# Patient Record
Sex: Female | Born: 1980 | Race: White | Hispanic: Yes | Marital: Married | Smoking: Never smoker
Health system: Southern US, Community
[De-identification: ages and names within clinical notes are randomized; demographics above are authoritative.]

## PROBLEM LIST (undated history)

## (undated) ENCOUNTER — Inpatient Hospital Stay (HOSPITAL_COMMUNITY): Payer: Self-pay

## (undated) DIAGNOSIS — Z789 Other specified health status: Secondary | ICD-10-CM

## (undated) DIAGNOSIS — IMO0001 Reserved for inherently not codable concepts without codable children: Secondary | ICD-10-CM

## (undated) HISTORY — DX: Reserved for inherently not codable concepts without codable children: IMO0001

## (undated) HISTORY — PX: NO PAST SURGERIES: SHX2092

## (undated) HISTORY — PX: OTHER SURGICAL HISTORY: SHX169

---

## 2012-08-19 ENCOUNTER — Encounter (HOSPITAL_COMMUNITY): Payer: Self-pay | Admitting: Emergency Medicine

## 2012-08-19 ENCOUNTER — Emergency Department (HOSPITAL_COMMUNITY)
Admission: EM | Admit: 2012-08-19 | Discharge: 2012-08-19 | Disposition: A | Discharge status: Home / Self Care | Payer: Self-pay | Attending: Emergency Medicine | Admitting: Emergency Medicine

## 2012-08-19 DIAGNOSIS — S61219A Laceration without foreign body of unspecified finger without damage to nail, initial encounter: Secondary | ICD-10-CM

## 2012-08-19 DIAGNOSIS — S61209A Unspecified open wound of unspecified finger without damage to nail, initial encounter: Secondary | ICD-10-CM | POA: Insufficient documentation

## 2012-08-19 DIAGNOSIS — Z79899 Other long term (current) drug therapy: Secondary | ICD-10-CM | POA: Insufficient documentation

## 2012-08-19 DIAGNOSIS — W260XXA Contact with knife, initial encounter: Secondary | ICD-10-CM | POA: Insufficient documentation

## 2012-08-19 DIAGNOSIS — Y9289 Other specified places as the place of occurrence of the external cause: Secondary | ICD-10-CM | POA: Insufficient documentation

## 2012-08-19 DIAGNOSIS — Y9389 Activity, other specified: Secondary | ICD-10-CM | POA: Insufficient documentation

## 2012-08-19 MED ORDER — HYDROCODONE-ACETAMINOPHEN 5-325 MG PO TABS: 1.0000 | ORAL_TABLET | ORAL | Status: DC | PRN

## 2012-08-19 NOTE — ED Provider Notes (Signed)
History    This chart was scribed for non-physician practitioner working with Selena Sprout, MD by Toya Smothers, ED Scribe. This patient was seen in room TR05C/TR05C and the patient's care was started at 9:45 PM.   CSN: [id number]  Arrival date & time 08/19/12  1912   First MD Initiated Contact with Patient 08/19/12 2133      Chief Complaint  Patient presents with  . Laceration   Patient is a 32 y.o. female presenting with skin laceration. The history is provided by the patient.  Laceration Location:  Finger Finger laceration location:  L index finger Quality: avulsion   Bleeding: controlled with pressure   Time since incident:  2 hours Laceration mechanism:  Knife Pain details:    Severity:  Mild Foreign body present:  No foreign bodies Relieved by:  Nothing Worsened by:  Nothing tried Ineffective treatments:  None tried Tetanus status:  Unknown   Selena Villanueva is a 32 y.o. female who presents to the ED c/o a flap laceration to the left index finger. Pain is mild. Pt reports that she cut her finger with a knife while opening a bag of flour. Blood loss was minimal, and bleeding is controlled with pressure. No fever, chills, cough, congestion, rhinorrhea, chest pain, SOB, or n/v/d. No suspected foreign body. Pt denies use of tobacco, alcohol, and illicit drug use. Pt is right handed. Tetanus status is unknown.   History reviewed. No pertinent past medical history.  History reviewed. No pertinent past surgical history.  No family history on file.  History  Substance Use Topics  . Smoking status: Never Smoker   . Smokeless tobacco: Not on file  . Alcohol Use: No    Review of Systems  Skin: Positive for wound.  All other systems reviewed and are negative.    Allergies  Review of patient's allergies indicates no known allergies.  Home Medications   Current Outpatient Rx  Name  Route  Sig  Dispense  Refill  . BIOTIN PO   Oral   Take 1 tablet by mouth 2 (two)  times daily.         . Multiple Vitamin (MULTIVITAMIN WITH MINERALS) TABS   Oral   Take 1 tablet by mouth daily.         . norethindrone (MICRONOR,CAMILA,ERRIN) 0.35 MG tablet   Oral   Take 1 tablet by mouth daily.         . Prenatal Vit-Fe Fumarate-FA (PRENATAL PO)   Oral   Take 1 tablet by mouth daily.           BP 117/63  Pulse 77  Temp(Src) 98 F (36.7 C) (Oral)  Resp 16  SpO2 97%  LMP 08/05/2012  Physical Exam  Nursing note and vitals reviewed. Constitutional: She is oriented to person, place, and time. She appears well-developed and well-nourished. No distress.  HENT:  Head: Normocephalic and atraumatic.  Eyes: EOM are normal.  Neck: Neck supple. No tracheal deviation present.  Cardiovascular: Normal rate.   Pulmonary/Chest: Effort normal. No respiratory distress.  Musculoskeletal: Normal range of motion.  Good flexion and extension.  Neurological: She is alert and oriented to person, place, and time.  Sensation is intact.  Skin: Skin is warm and dry.  Flat laceration to the palmer surface. Left second finger.  Psychiatric: She has a normal mood and affect. Her behavior is normal.    ED Course  Procedures DIAGNOSTIC STUDIES: Oxygen Saturation is 97% on room air, normal by my interpretation.  COORDINATION OF CARE: 21:49- Evaluated Pt. Pt is awake, alert, and without distress. 21:52- Patient understands and agrees with initial ED impression and plan with expectations set for ED visit.  LACERATION REPAIR Performed by: Kathie Dike, P Consent: Verbal consent obtained. Risks and benefits: risks, benefits and alternatives were discussed Patient identity confirmed: provided demographic data Time out performed prior to procedure Prepped and Draped in normal sterile fashion Wound explored Laceration Location: Palmer surface of left second finger. Laceration Length: 2.6 cm No Foreign Bodies seen or palpated Anesthesia: local infiltration Local  anesthetic: lidocaine1*% Anesthetic total:2 ml Irrigation method: syringe Amount of cleaning: standard Skin closure: suture Number of sutures 5 Technique:interupted Patient tolerance: Patient tolerated the procedure well with no immediate complications.     Labs Reviewed - No data to display No results found.   No diagnosis found.    MDM  I have reviewed nursing notes, vital signs, and all appropriate lab and imaging results for this patient.  Pt sustained a laceration of the left index finger. Laceration repaired with 5 sutures of 4-0 nylon. Pt to have sutures removed in 7 days. She will return sooner if any changes or problem.    *I personally performed the services described in this documentation, which was scribed in my presence. The recorded information has been reviewed and is accurate.Kathie Dike, PA-C 08/19/12 2226

## 2012-08-19 NOTE — ED Notes (Signed)
Pt st's she cut her left index finger with a knife while trying to open flour. Pt has flap lac to tip of finger.

## 2012-08-20 NOTE — ED Provider Notes (Signed)
Medical screening examination/treatment/procedure(s) were performed by non-physician practitioner and as supervising physician I was immediately available for consultation/collaboration.   Gwyneth Sprout, MD 08/20/12 [phone]

## 2014-02-08 ENCOUNTER — Other Ambulatory Visit: Payer: Self-pay | Admitting: Otolaryngology

## 2014-02-08 DIAGNOSIS — R42 Dizziness and giddiness: Secondary | ICD-10-CM

## 2014-02-09 ENCOUNTER — Ambulatory Visit
Admission: RE | Admit: 2014-02-09 | Discharge: 2014-02-09 | Disposition: A | Discharge status: Home / Self Care | Payer: BC Managed Care – PPO | Source: Ambulatory Visit | Attending: Otolaryngology | Admitting: Otolaryngology

## 2014-02-09 DIAGNOSIS — R42 Dizziness and giddiness: Secondary | ICD-10-CM

## 2014-02-09 MED ORDER — GADOBENATE DIMEGLUMINE 529 MG/ML IV SOLN
12.0000 mL | Freq: Once | INTRAVENOUS | Status: AC | PRN
  Administered 2014-02-09: 12 mL via INTRAVENOUS

## 2014-05-28 ENCOUNTER — Emergency Department (HOSPITAL_COMMUNITY)
Admission: EM | Admit: 2014-05-28 | Discharge: 2014-05-28 | Disposition: A | Discharge status: Home / Self Care | Payer: BLUE CROSS/BLUE SHIELD | Attending: Emergency Medicine | Admitting: Emergency Medicine

## 2014-05-28 ENCOUNTER — Encounter (HOSPITAL_COMMUNITY): Payer: Self-pay | Admitting: Emergency Medicine

## 2014-05-28 ENCOUNTER — Emergency Department (HOSPITAL_COMMUNITY): Payer: BLUE CROSS/BLUE SHIELD

## 2014-05-28 DIAGNOSIS — Z79899 Other long term (current) drug therapy: Secondary | ICD-10-CM | POA: Insufficient documentation

## 2014-05-28 DIAGNOSIS — R202 Paresthesia of skin: Secondary | ICD-10-CM

## 2014-05-28 DIAGNOSIS — R0981 Nasal congestion: Secondary | ICD-10-CM | POA: Diagnosis not present

## 2014-05-28 DIAGNOSIS — Z880 Allergy status to penicillin: Secondary | ICD-10-CM | POA: Diagnosis not present

## 2014-05-28 DIAGNOSIS — R0789 Other chest pain: Secondary | ICD-10-CM | POA: Insufficient documentation

## 2014-05-28 DIAGNOSIS — Z3202 Encounter for pregnancy test, result negative: Secondary | ICD-10-CM | POA: Diagnosis not present

## 2014-05-28 DIAGNOSIS — R42 Dizziness and giddiness: Secondary | ICD-10-CM | POA: Diagnosis present

## 2014-05-28 LAB — CBC WITH DIFFERENTIAL/PLATELET
BASOS PCT: 0 % (ref 0–1)
Basophils Absolute: 0 10*3/uL (ref 0.0–0.1)
Eosinophils Absolute: 0 10*3/uL (ref 0.0–0.7)
Eosinophils Relative: 0 % (ref 0–5)
HCT: 37 % (ref 36.0–46.0)
HEMOGLOBIN: 12.1 g/dL (ref 12.0–15.0)
LYMPHS ABS: 2.2 10*3/uL (ref 0.7–4.0)
Lymphocytes Relative: 29 % (ref 12–46)
MCH: 29 pg (ref 26.0–34.0)
MCHC: 32.7 g/dL (ref 30.0–36.0)
MCV: 88.7 fL (ref 78.0–100.0)
MONO ABS: 0.5 10*3/uL (ref 0.1–1.0)
Monocytes Relative: 7 % (ref 3–12)
NEUTROS ABS: 4.6 10*3/uL (ref 1.7–7.7)
NEUTROS PCT: 64 % (ref 43–77)
Platelets: 267 10*3/uL (ref 150–400)
RBC: 4.17 MIL/uL (ref 3.87–5.11)
RDW: 12.5 % (ref 11.5–15.5)
WBC: 7.4 10*3/uL (ref 4.0–10.5)

## 2014-05-28 LAB — BASIC METABOLIC PANEL
Anion gap: 7 (ref 5–15)
BUN: 13 mg/dL (ref 6–23)
CALCIUM: 9.4 mg/dL (ref 8.4–10.5)
CO2: 26 mmol/L (ref 19–32)
Chloride: 106 mmol/L (ref 96–112)
Creatinine, Ser: 0.63 mg/dL (ref 0.50–1.10)
GLUCOSE: 89 mg/dL (ref 70–99)
POTASSIUM: 4 mmol/L (ref 3.5–5.1)
SODIUM: 139 mmol/L (ref 135–145)

## 2014-05-28 LAB — I-STAT TROPONIN, ED: Troponin i, poc: 0 ng/mL (ref 0.00–0.08)

## 2014-05-28 LAB — POC URINE PREG, ED: PREG TEST UR: NEGATIVE

## 2014-05-28 NOTE — ED Provider Notes (Signed)
CSN: [id number]     Arrival date & time 05/28/14  1343 History   First MD Initiated Contact with Patient 05/28/14 1507     Chief Complaint  Patient presents with  . Dizziness  . arm numbness      (Consider location/radiation/quality/duration/timing/severity/associated sxs/prior Treatment) HPI Comments: Selena Villanueva is a 34 y.o. female with a PMHx of lightheadedness, vitamin D deficiency, and intermittent paresthesias, who presents to the ED with complaints of ongoing intermittent lightheadedness and left arm paresthesia which initially began last August in after being seen by an ear nose and throat specialist and having an MRI of her brain done, no etiology was found. Her symptoms resolved by December, but approximately 2 weeks ago they began again. Her symptoms are associated with a sensation of nasal congestion, and intermittent chest pressure anteriorly without pain. She states that she took meclizine earlier which has helped with her symptoms, and at this moment they are partially resolved. Her lightheadedness occasionally is elicited with head movement, but not always. She cannot pinpoint a specific aggravating factor. She denies any fevers, chills, chest pain, shortness of breath, cough, hemoptysis, PND, lower extremity swelling, orthopnea, recent travel, surgeries, immobilization, estrogen use, IV drug use, rhinorrhea, tinnitus, hearing loss, URI symptoms, abdominal pain, nausea, vomiting, diarrhea, constipation, dysuria, hematuria, melena, hematochezia, vaginal bleeding or discharge, vision changes, headache, vertigo, syncope, or recent change in medications. She has no family or personal history of DVT/PE/heart disease. Last menstrual period was January 14 through the 18th.  Patient is a 34 y.o. female presenting with dizziness. The history is provided by the patient. No language interpreter was used.  Dizziness Quality:  Lightheadedness Severity:  Moderate Onset quality:   Gradual Duration:  2 weeks Timing:  Intermittent Progression:  Partially resolved Chronicity:  Recurrent Context: head movement   Relieved by:  Medication (meclizine) Worsened by:  Turning head Ineffective treatments:  None tried Associated symptoms: no blood in stool, no chest pain, no diarrhea, no headaches, no hearing loss, no nausea, no palpitations, no shortness of breath, no syncope, no tinnitus, no vision changes, no vomiting and no weakness   Risk factors: no new medications     History reviewed. No pertinent past medical history. History reviewed. No pertinent past surgical history. No family history on file. History  Substance Use Topics  . Smoking status: Never Smoker   . Smokeless tobacco: Not on file  . Alcohol Use: No   OB History    No data available     Review of Systems  Constitutional: Negative for fever, chills and diaphoresis.  HENT: Positive for congestion (nasal). Negative for ear discharge, ear pain, hearing loss, rhinorrhea, sinus pressure, sore throat, tinnitus and trouble swallowing.   Eyes: Negative for visual disturbance.  Respiratory: Positive for chest tightness ("pressure"). Negative for cough, shortness of breath and wheezing.   Cardiovascular: Negative for chest pain, palpitations, leg swelling and syncope.  Gastrointestinal: Negative for nausea, vomiting, abdominal pain, diarrhea, constipation and blood in stool.  Genitourinary: Negative for dysuria, hematuria, flank pain, vaginal bleeding, vaginal discharge and menstrual problem.  Musculoskeletal: Negative for myalgias and arthralgias.  Skin: Negative for color change and rash.  Allergic/Immunologic: Negative for immunocompromised state.  Neurological: Positive for light-headedness. Negative for dizziness, syncope, weakness, numbness and headaches.       +L arm paresthesias  Psychiatric/Behavioral: Negative for confusion.   10 Systems reviewed and are negative for acute change except as  noted in the HPI.    Allergies  Amoxicillin  Home Medications   Prior to Admission medications   Medication Sig Start Date End Date Taking? Authorizing Provider  BIOTIN PO Take 1 tablet by mouth 2 (two) times daily.    Historical Provider, MD  HYDROcodone-acetaminophen (NORCO/VICODIN) 5-325 MG per tablet Take 1 tablet by mouth every 4 (four) hours as needed for pain. 08/19/12   Kathie Dike, PA-C  Multiple Vitamin (MULTIVITAMIN WITH MINERALS) TABS Take 1 tablet by mouth daily.    Historical Provider, MD  norethindrone (MICRONOR,CAMILA,ERRIN) 0.35 MG tablet Take 1 tablet by mouth daily.    Historical Provider, MD  Prenatal Vit-Fe Fumarate-FA (PRENATAL PO) Take 1 tablet by mouth daily.    Historical Provider, MD   BP 97/61 mmHg  Pulse 79  Temp(Src) 98.2 F (36.8 C) (Oral)  Resp 16  SpO2 99%  LMP 05/17/2014 Physical Exam  Constitutional: She is oriented to person, place, and time. She appears well-developed and well-nourished.  Non-toxic appearance. No distress.  Afebrile, nontoxic, NAD, VSS aside from mildly low BP at 91/61  HENT:  Head: Normocephalic and atraumatic.  Right Ear: Hearing, tympanic membrane, external ear and ear canal normal.  Left Ear: Hearing, tympanic membrane, external ear and ear canal normal.  Nose: Nose normal. Right sinus exhibits no maxillary sinus tenderness and no frontal sinus tenderness. Left sinus exhibits no maxillary sinus tenderness and no frontal sinus tenderness.  Mouth/Throat: Uvula is midline, oropharynx is clear and moist and mucous membranes are normal. No trismus in the jaw. No uvula swelling.  Ears clear bilaterally Nose clear without edema or rhinorrhea No sinus TTP Oropharynx clear without tonsillar swelling/exudates, uvula midline with symmetric tongue protrusion  Eyes: Conjunctivae and EOM are normal. Pupils are equal, round, and reactive to light. Right eye exhibits no discharge. Left eye exhibits no discharge.  PERRL, EOMI without  nystagmus  Neck: Normal range of motion. Neck supple.  No meningismus  Cardiovascular: Normal rate, regular rhythm, normal heart sounds and intact distal pulses.  Exam reveals no gallop and no friction rub.   No murmur heard. RRR, nl s1/s2, no m/r/g, distal pulses intact, no pedal edema   Pulmonary/Chest: Effort normal and breath sounds normal. No respiratory distress. She has no decreased breath sounds. She has no wheezes. She has no rhonchi. She has no rales.  Abdominal: Soft. Normal appearance and bowel sounds are normal. She exhibits no distension. There is no tenderness. There is no rigidity, no rebound and no guarding.  Musculoskeletal: Normal range of motion.  MAE x4 Strength 5/5 in all extremities Sensation grossly intact in all extremities No pedal edema Distal pulses intact Gait steady  Neurological: She is alert and oriented to person, place, and time. She has normal strength. No cranial nerve deficit or sensory deficit. She displays a negative Romberg sign. Coordination and gait normal. GCS eye subscore is 4. GCS verbal subscore is 5. GCS motor subscore is 6.  CN 2-12 grossly intact A&O x4 GCS 15 Sensation and strength intact Gait nonataxic including with tandem walking Coordination with finger-to-nose WNL Neg romberg, neg pronator drift   Skin: Skin is warm, dry and intact. No rash noted.  Psychiatric: She has a normal mood and affect.  Nursing note and vitals reviewed.   ED Course  Procedures (including critical care time) 16:04 Orthostatic Vital Signs JP  Orthostatic Lying  - BP- Lying: 112/60 mmHg ; Pulse- Lying: 60  Orthostatic Sitting - BP- Sitting: 108/59 mmHg ; Pulse- Sitting: 68  Orthostatic Standing at 0 minutes - BP- Standing  at 0 minutes: 106/65 mmHg ; Pulse- Standing at 0 minutes: 79    Labs Review Labs Reviewed  CBC WITH DIFFERENTIAL/PLATELET  BASIC METABOLIC PANEL  I-STAT TROPOININ, ED  POC URINE PREG, ED    Imaging Review Dg Chest 2  View  05/28/2014   CLINICAL DATA:  Chest pressure radiating into the back with dizziness beginning today.  EXAM: CHEST  2 VIEW  COMPARISON:  PA and lateral chest 04/09/2014.  FINDINGS: Heart size and mediastinal contours are within normal limits. Both lungs are clear. Visualized skeletal structures are unremarkable.  IMPRESSION: Negative exam.   Electronically Signed   By: Drusilla Kannerhomas  Dalessio M.D.   On: 05/28/2014 16:31   MRI 02/09/14: Negative   EKG Interpretation   Date/Time:  Monday May 28 2014 15:49:55 EST Ventricular Rate:  71 PR Interval:  135 QRS Duration: 88 QT Interval:  394 QTC Calculation: 428 R Axis:   75 Text Interpretation:  Sinus rhythm Confirmed by Lincoln Brighamees, Liz [phone]([id number]) on  05/28/2014 4:31:06 PM      MDM   Final diagnoses:  Chest pressure  Episodic lightheadedness  Paresthesias    34 y.o. female with multiple complaints which are chronic, including intermittent lightheadedness and L arm paresthesia currently resolved. Also sinus congestion. Now also having some chest pressure without pain. PERC neg, doubt DVT/PE. Has had neg MRI and work up in the past, nonfocal neuro exam, doubt need for emergent imaging of brain. Doubt meningitis, CVA/TIA, temporal arteritis, SAH, or other intracranial pathology. Will obtain EKG, CXR, basic labs, and orthostatics. If no etiology seen, will have her f/up with PCP for ongoing evaluation. Possibly could be vitamin deficiency. BP slightly low here which could be normal variant, will get orthostatics then give IVFs if positive. Will reassess shortly.   4:44 PM Orthostatics neg, and BP has improved to 112/60 lying and 106/65 standing. All labs WNL, trop neg, CXR WNL, EKG NSR. Doubt ACS at this time. No emergent etiology for her symptoms are found at this time. Discussed f/up with her PCP for ongoing evaluation and management of her chronic symptoms. Will instruct pt to continue using meclizine for symptoms. I explained the diagnosis and have  given explicit precautions to return to the ER including for any other new or worsening symptoms. The patient understands and accepts the medical plan as it's been dictated and I have answered their questions. Discharge instructions concerning home care and prescriptions have been given. The patient is STABLE and is discharged to home in good condition.   BP 106/65 mmHg  Pulse 79  Temp(Src) 98.2 F (36.8 C) (Oral)  Resp 16  SpO2 99%  LMP 05/17/2014     Donnita FallsMercedes Strupp [place] Point[place], PA-C 05/28/14 1653  Tilden FossaElizabeth Rees, MD 05/28/14 1946

## 2014-05-28 NOTE — ED Notes (Signed)
Pt states that she has been having dizziness, trouble with her breathing since mid August 2015.  Pt states that pver the past week the symptoms have returned then today had near syncopal episode, facial and left arm numbness.  Pt states that she took Naso cort this am.

## 2014-05-28 NOTE — Discharge Instructions (Signed)
Continue to use your home meclizine for your symptoms. Stay well hydrated and continue your usual home medications. See your regular doctor for ongoing management of your symptoms. Return to the ER for changes or worsening symptoms.   Benign Positional Vertigo Vertigo means you feel like you or your surroundings are moving when they are not. Benign positional vertigo is the most common form of vertigo. Benign means that the cause of your condition is not serious. Benign positional vertigo is more common in older adults. CAUSES  Benign positional vertigo is the result of an upset in the labyrinth system. This is an area in the middle ear that helps control your balance. This may be caused by a viral infection, head injury, or repetitive motion. However, often no specific cause is found. SYMPTOMS  Symptoms of benign positional vertigo occur when you move your head or eyes in different directions. Some of the symptoms may include:  Loss of balance and falls.  Vomiting.  Blurred vision.  Dizziness.  Nausea.  Involuntary eye movements (nystagmus). DIAGNOSIS  Benign positional vertigo is usually diagnosed by physical exam. If the specific cause of your benign positional vertigo is unknown, your caregiver may perform imaging tests, such as magnetic resonance imaging (MRI) or computed tomography (CT). TREATMENT  Your caregiver may recommend movements or procedures to correct the benign positional vertigo. Medicines such as meclizine, benzodiazepines, and medicines for nausea may be used to treat your symptoms. In rare cases, if your symptoms are caused by certain conditions that affect the inner ear, you may need surgery. HOME CARE INSTRUCTIONS   Follow your caregiver's instructions.  Move slowly. Do not make sudden body or head movements.  Avoid driving.  Avoid operating heavy machinery.  Avoid performing any tasks that would be dangerous to you or others during a vertigo  episode.  Drink enough fluids to keep your urine clear or pale yellow. SEEK IMMEDIATE MEDICAL CARE IF:   You develop problems with walking, weakness, numbness, or using your arms, hands, or legs.  You have difficulty speaking.  You develop severe headaches.  Your nausea or vomiting continues or gets worse.  You develop visual changes.  Your family or friends notice any behavioral changes.  Your condition gets worse.  You have a fever.  You develop a stiff neck or sensitivity to light. MAKE SURE YOU:   Understand these instructions.  Will watch your condition.  Will get help right away if you are not doing well or get worse. Document Released: 01/26/2006 Document Revised: 07/13/2011 Document Reviewed: 01/08/2011 [facility] Patient Information 2015 [place], [place]. This information is not intended to replace advice given to you by your health care provider. Make sure you discuss any questions you have with your health care provider.  Paresthesia Paresthesia is a burning or prickling feeling. This feeling can happen in any part of the body. It often happens in the hands, arms, legs, or feet. HOME CARE  Avoid drinking alcohol.  Try massage or needle therapy (acupuncture) to help with your problems.  Keep all doctor visits as told. GET HELP RIGHT AWAY IF:   You feel weak.  You have trouble walking or moving.  You have problems speaking or seeing.  You feel confused.  You cannot control when you poop (bowel movement) or pee (urinate).  You lose feeling (numbness) after an injury.  You pass out (faint).  Your burning or prickling feeling gets worse when you walk.  You have pain, cramps, or feel dizzy.  You  have a rash. MAKE SURE YOU:   Understand these instructions.  Will watch your condition.  Will get help right away if you are not doing well or get worse. Document Released: 04/02/2008 Document Revised: 07/13/2011 Document Reviewed: 01/09/2011 [facility] [employer]  Patient Information 2015 [place]. This information is not intended to replace advice given to you by your health care provider. Make sure you discuss any questions you have with your health care provider.

## 2014-06-21 ENCOUNTER — Encounter: Payer: Self-pay | Admitting: Neurology

## 2014-06-21 ENCOUNTER — Ambulatory Visit (INDEPENDENT_AMBULATORY_CARE_PROVIDER_SITE_OTHER): Payer: BLUE CROSS/BLUE SHIELD | Admitting: Neurology

## 2014-06-21 VITALS — BP 109/63 | HR 68 | Ht 62.0 in | Wt 147.0 lb

## 2014-06-21 DIAGNOSIS — R42 Dizziness and giddiness: Secondary | ICD-10-CM

## 2014-06-21 NOTE — Progress Notes (Signed)
PATIENT: Selena Villanueva DOB: 09/01/1980  HISTORICAL  Selena Villanueva 34 yo RH female, referred by her primary care physician, PA Selena Villanueva for evaluation of dizziness,  Since August 2015, she began to note disc intermittent lightheadedness, it can happen in sitting down, standing up position, worsening by sudden body movement, but she denies vertigo, no visual change, no gait difficulty, intermittent initially, then become constant, she was evaluated by ENT, reported normal hearing, vestibular testing, MRI of the brain with and without contrast October 2015 was essentially normal,  Her symptoms has improved in December, but returned in January 2016, she also noticed associated chest pressure, heart palpitation, anxious feelings,  REVIEW OF SYSTEMS: Full 14 system review of systems performed and notable only for as above ALLERGIES: Allergies  Allergen Reactions  . Amoxicillin Diarrhea    HOME MEDICATIONS: Current Outpatient Prescriptions  Medication Sig Dispense Refill  . BIOTIN PO Take 1 tablet by mouth daily.     . cetirizine (ZYRTEC) 10 MG tablet Take 10 mg by mouth as needed for allergies.    . Cholecalciferol (VITAMIN D) 2000 UNITS CAPS Take 1 capsule by mouth daily.    Marland Kitchen. dimenhyDRINATE (DRAMAMINE) 50 MG tablet Take 50 mg by mouth every 8 (eight) hours as needed.    . Prenatal Vit-Fe Fumarate-FA (MULTIVITAMIN-PRENATAL) 27-0.8 MG TABS tablet Take 1 tablet by mouth daily at 12 noon.    . triamcinolone (NASACORT) 55 MCG/ACT AERO nasal inhaler Place 1 spray into the nose daily as needed (for allergies).    . vitamin B-12 (CYANOCOBALAMIN) 500 MCG tablet Take 500 mcg by mouth daily.     No current facility-administered medications for this visit.    PAST MEDICAL HISTORY: Past Medical History  Diagnosis Date  . Healthy adult     PAST SURGICAL HISTORY: Past Surgical History  Procedure Laterality Date  . No past surgery      FAMILY HISTORY: Family History  Problem  Relation Age of Onset  . Healthy Father   . Healthy Mother     SOCIAL HISTORY:  History   Social History  . Marital Status: Married    Spouse Name: Married  . Number of Children: 0  . Years of Education: 14   Occupational History  . Librarian, academicLegal Assistant    Social History Main Topics  . Smoking status: Never Smoker   . Smokeless tobacco: Not on file  . Alcohol Use: 0.0 oz/week    0 Standard drinks or equivalent per week     Comment: Socially  . Drug Use: No  . Sexual Activity: Not on file   Other Topics Concern  . Not on file   Social History Narrative   Right-handed.   Lives at home with her husband.   2-3 cups caffeine/day.    PHYSICAL EXAM   Filed Vitals:   06/21/14 0816  BP: 109/63  Pulse: 68  Height: 5\' 2"  (1.575 m)  Weight: 147 lb (66.679 kg)    Not recorded      Body mass index is 26.88 kg/(m^2).   Generalized: In no acute distress  Neck: Supple, no carotid bruits   Cardiac: Regular rate rhythm  Pulmonary: Clear to auscultation bilaterally  Musculoskeletal: No deformity  Neurological examination  Mentation: Alert oriented to time, place, history taking, and causual conversation  Cranial nerve II-XII: Pupils were equal round reactive to light. Extraocular movements were full.  Visual field were full on confrontational test. Bilateral fundi were sharp.  Facial sensation and strength  were normal. Hearing was intact to finger rubbing bilaterally. Uvula tongue midline.  Head turning and shoulder shrug and were normal and symmetric.Tongue protrusion into cheek strength was normal.  Motor: Normal tone, bulk and strength.  Sensory: Intact to fine touch, pinprick, preserved vibratory sensation, and proprioception at toes.  Coordination: Normal finger to nose, heel-to-shin bilaterally there was no truncal ataxia  Gait: Rising up from seated position without assistance, normal stance, without trunk ataxia, moderate stride, good arm swing, smooth  turning, able to perform tiptoe, and heel walking without difficulty.   Romberg signs: Negative  Deep tendon reflexes: Brachioradialis 2/2, biceps 2/2, triceps 2/2, patellar 2/2, Achilles 2/2, plantar responses were flexor bilaterally.   DIAGNOSTIC DATA (LABS, IMAGING, TESTING) - I reviewed patient records, labs, notes, testing and imaging myself where available.  Lab Results  Component Value Date   WBC 7.4 05/28/2014   HGB 12.1 05/28/2014   HCT 37.0 05/28/2014   MCV 88.7 05/28/2014   PLT 267 05/28/2014      Component Value Date/Time   NA 139 05/28/2014 1555   K 4.0 05/28/2014 1555   CL 106 05/28/2014 1555   CO2 26 05/28/2014 1555   GLUCOSE 89 05/28/2014 1555   BUN 13 05/28/2014 1555   CREATININE 0.63 05/28/2014 1555   CALCIUM 9.4 05/28/2014 1555   GFRNONAA >90 05/28/2014 1555   GFRAA >90 05/28/2014 1555   No results found for: CHOL, HDL, LDLCALC, LDLDIRECT, TRIG, CHOLHDL No results found for: ZOXW9U No results found for: VITAMINB12 No results found for: TSH    ASSESSMENT AND PLAN  Selena Villanueva is a 34 y.o. female complains of lightheadedness, essentially normal neurological examination, normal MRI of the brain, negative internal acoustic canal,  Nonspecific complaints, no neurological etiology found,  Continue follow-up with her primary care physician,    No orders of the defined types were placed in this encounter.    New Prescriptions   No medications on file    Medications Discontinued During This Encounter  Medication Reason  . Acetaminophen-Caffeine 500-65 MG TABS Therapy completed  . HYDROcodone-acetaminophen (NORCO/VICODIN) 5-325 MG per tablet Therapy completed  . Magnesium 200 MG TABS Patient Preference  . Meclizine HCl 25 MG CHEW Therapy completed  . Omega-3 Fatty Acids (OMEGA 3 PO) Patient Preference    No Follow-up on file. Selena Villanueva, M.D. Ph.D.  [facility] [address], [address] [place], [place] [address] Ph: [phone] Fax: [phone]

## 2014-06-22 ENCOUNTER — Telehealth: Payer: Self-pay | Admitting: Neurology

## 2014-06-22 ENCOUNTER — Encounter: Payer: Self-pay | Admitting: Neurology

## 2014-06-27 NOTE — Telephone Encounter (Signed)
error 

## 2015-03-04 LAB — OB RESULTS CONSOLE ABO/RH: RH TYPE: POSITIVE

## 2015-03-04 LAB — OB RESULTS CONSOLE GC/CHLAMYDIA
Chlamydia: NEGATIVE
GC PROBE AMP, GENITAL: NEGATIVE

## 2015-03-04 LAB — OB RESULTS CONSOLE RPR: RPR: NONREACTIVE

## 2015-03-04 LAB — OB RESULTS CONSOLE RUBELLA ANTIBODY, IGM: RUBELLA: IMMUNE

## 2015-03-04 LAB — OB RESULTS CONSOLE HIV ANTIBODY (ROUTINE TESTING): HIV: NONREACTIVE

## 2015-03-04 LAB — OB RESULTS CONSOLE ANTIBODY SCREEN: ANTIBODY SCREEN: NEGATIVE

## 2015-03-04 LAB — OB RESULTS CONSOLE HEPATITIS B SURFACE ANTIGEN: HEP B S AG: NEGATIVE

## 2015-03-16 ENCOUNTER — Encounter (HOSPITAL_COMMUNITY): Payer: Self-pay

## 2015-03-16 ENCOUNTER — Inpatient Hospital Stay (HOSPITAL_COMMUNITY): Payer: BLUE CROSS/BLUE SHIELD

## 2015-03-16 ENCOUNTER — Inpatient Hospital Stay (HOSPITAL_COMMUNITY)
Admission: AD | Admit: 2015-03-16 | Discharge: 2015-03-16 | Disposition: A | Discharge status: Home / Self Care | Payer: BLUE CROSS/BLUE SHIELD | Source: Ambulatory Visit | Attending: Obstetrics and Gynecology | Admitting: Obstetrics and Gynecology

## 2015-03-16 DIAGNOSIS — Z3A11 11 weeks gestation of pregnancy: Secondary | ICD-10-CM | POA: Insufficient documentation

## 2015-03-16 DIAGNOSIS — Z880 Allergy status to penicillin: Secondary | ICD-10-CM | POA: Diagnosis not present

## 2015-03-16 DIAGNOSIS — O209 Hemorrhage in early pregnancy, unspecified: Secondary | ICD-10-CM | POA: Insufficient documentation

## 2015-03-16 DIAGNOSIS — R58 Hemorrhage, not elsewhere classified: Secondary | ICD-10-CM

## 2015-03-16 HISTORY — DX: Other specified health status: Z78.9

## 2015-03-16 LAB — URINALYSIS, ROUTINE W REFLEX MICROSCOPIC
Bilirubin Urine: NEGATIVE
Glucose, UA: NEGATIVE mg/dL
Ketones, ur: NEGATIVE mg/dL
Leukocytes, UA: NEGATIVE
Nitrite: NEGATIVE
Protein, ur: NEGATIVE mg/dL
Specific Gravity, Urine: 1.02 (ref 1.005–1.030)
Urobilinogen, UA: 0.2 mg/dL (ref 0.0–1.0)
pH: 8.5 — ABNORMAL HIGH (ref 5.0–8.0)

## 2015-03-16 LAB — CBC
HCT: 33.6 % — ABNORMAL LOW (ref 36.0–46.0)
Hemoglobin: 11.3 g/dL — ABNORMAL LOW (ref 12.0–15.0)
MCH: 28.8 pg (ref 26.0–34.0)
MCHC: 33.6 g/dL (ref 30.0–36.0)
MCV: 85.7 fL (ref 78.0–100.0)
Platelets: 252 K/uL (ref 150–400)
RBC: 3.92 MIL/uL (ref 3.87–5.11)
RDW: 13.3 % (ref 11.5–15.5)
WBC: 7.9 K/uL (ref 4.0–10.5)

## 2015-03-16 LAB — ABO/RH: ABO/RH(D): A POS

## 2015-03-16 LAB — URINE MICROSCOPIC-ADD ON: RBC / HPF: NONE SEEN RBC/hpf (ref <3)

## 2015-03-16 NOTE — Discharge Instructions (Signed)
Vaginal Bleeding During Pregnancy, First Trimester A small amount of bleeding (spotting) from the vagina is relatively common in early pregnancy. It usually stops on its own. Various things may cause bleeding or spotting in early pregnancy. Some bleeding may be related to the pregnancy, and some may not. In most cases, the bleeding is normal and is not a problem. However, bleeding can also be a sign of something serious. Be sure to tell your health care provider about any vaginal bleeding right away. Some possible causes of vaginal bleeding during the first trimester include:  Infection or inflammation of the cervix.  Growths (polyps) on the cervix.  Miscarriage or threatened miscarriage.  Pregnancy tissue has developed outside of the uterus and in a fallopian tube (tubal pregnancy).  Tiny cysts have developed in the uterus instead of pregnancy tissue (molar pregnancy). HOME CARE INSTRUCTIONS  Watch your condition for any changes. The following actions may help to lessen any discomfort you are feeling:  Follow your health care provider's instructions for limiting your activity. If your health care provider orders bed rest, you may need to stay in bed and only get up to use the bathroom. However, your health care provider may allow you to continue light activity.  If needed, make plans for someone to help with your regular activities and responsibilities while you are on bed rest.  Keep track of the number of pads you use each day, how often you change pads, and how soaked (saturated) they are. Write this down.  Do not use tampons. Do not douche.  Do not have sexual intercourse or orgasms until approved by your health care provider.  If you pass any tissue from your vagina, save the tissue so you can show it to your health care provider.  Only take over-the-counter or prescription medicines as directed by your health care provider.  Do not take aspirin because it can make you  bleed.  Keep all follow-up appointments as directed by your health care provider. SEEK MEDICAL CARE IF:  You have any vaginal bleeding during any part of your pregnancy.  You have cramps or labor pains.  You have a fever, not controlled by medicine. SEEK IMMEDIATE MEDICAL CARE IF:   You have severe cramps in your back or belly (abdomen).  You pass large clots or tissue from your vagina.  Your bleeding increases.  You feel light-headed or weak, or you have fainting episodes.  You have chills.  You are leaking fluid or have a gush of fluid from your vagina.  You pass out while having a bowel movement. MAKE SURE YOU:  Understand these instructions.  Will watch your condition.  Will get help right away if you are not doing well or get worse.   This information is not intended to replace advice given to you by your health care provider. Make sure you discuss any questions you have with your health care provider.   Document Released: 01/28/2005 Document Revised: 04/25/2013 Document Reviewed: 12/26/2012 Elsevier Interactive Patient Education 2016 Elsevier Inc[employer] Subchorionic Hematoma A subchorionic hematoma is a gathering of blood between the outer wall of the placenta and the inner wall of the womb (uterus). The placenta is the organ that connects the fetus to the wall of the uterus. The placenta performs the feeding, breathing (oxygen to the fetus), and waste removal (excretory work) of the fetus.  Subchorionic hematoma is the most common abnormality found on a result from ultrasonography done during the first trimester or early second trimester  of pregnancy. If there has been little or no vaginal bleeding, early small hematomas usually shrink on their own and do not affect your baby or pregnancy. The blood is gradually absorbed over 1-2 weeks. When bleeding starts later in pregnancy or the hematoma is larger or occurs in an older pregnant woman, the outcome may not be as good.  Larger hematomas may get bigger, which increases the chances for miscarriage. Subchorionic hematoma also increases the risk of premature detachment of the placenta from the uterus, preterm (premature) labor, and stillbirth. °HOME CARE INSTRUCTIONS °· Stay on bed rest if your health care provider recommends this. Although bed rest will not prevent more bleeding or prevent a miscarriage, your health care provider may recommend bed rest until you are advised otherwise. °· Avoid heavy lifting (more than 10 lb [4.5 kg]), exercise, sexual intercourse, or douching as directed by your health care provider. °· Keep track of the number of pads you use each day and how soaked (saturated) they are. Write down this information. °· Do not use tampons. °· Keep all follow-up appointments as directed by your health care provider. Your health care provider may ask you to have follow-up blood tests or ultrasound tests or both. °SEEK IMMEDIATE MEDICAL CARE IF: °· You have severe cramps in your stomach, back, abdomen, or pelvis. °· You have a fever. °· You pass large clots or tissue. Save any tissue for your health care provider to look at. °· Your bleeding increases or you become lightheaded, feel weak, or have fainting episodes. °  °This information is not intended to replace advice given to you by your health care provider. Make sure you discuss any questions you have with your health care provider. °  °Document Released: 08/05/2006 Document Revised: 05/11/2014 Document Reviewed: 11/17/2012 °Elsevier Interactive Patient Education ©2016 Elsevier Inc. ° °

## 2015-03-16 NOTE — MAU Note (Signed)
Patient presents with onset of bright red vaginal bleeding upon awakening this morning, lower back discomfort, not soaking a pad but every time goes to bathroom sees on toilet tissue.

## 2015-03-16 NOTE — MAU Provider Note (Signed)
History     CSN: [id number]  Arrival date and time: 03/16/15 9147   First Provider Initiated Contact with Patient 03/16/15 [phone]      Chief Complaint  Patient presents with  . Vaginal Bleeding   HPI  Selena Villanueva 34 y.o. G1P0 @ [redacted]w[redacted]d presents to MAU after having sudden onset of bright red bleeding this morning. She has also been having lower abdominal cramping. She denies having intercourse prior to  onset of bleeding  Past Medical History  Diagnosis Date  . Healthy adult   . Medical history non-contributory     Past Surgical History  Procedure Laterality Date  . No past surgery    . No past surgeries      Family History  Problem Relation Age of Onset  . Healthy Father   . Healthy Mother     Social History  Substance Use Topics  . Smoking status: Never Smoker   . Smokeless tobacco: None  . Alcohol Use: 0.0 oz/week    0 Standard drinks or equivalent per week     Comment: Socially    Allergies:  Allergies  Allergen Reactions  . Amoxicillin Diarrhea    Prescriptions prior to admission  Medication Sig Dispense Refill Last Dose  . BIOTIN PO Take 1 tablet by mouth daily.    Taking  . cetirizine (ZYRTEC) 10 MG tablet Take 10 mg by mouth as needed for allergies.   Taking  . Cholecalciferol (VITAMIN D) 2000 UNITS CAPS Take 1 capsule by mouth daily.   Taking  . dimenhyDRINATE (DRAMAMINE) 50 MG tablet Take 50 mg by mouth every 8 (eight) hours as needed.   Taking  . Prenatal Vit-Fe Fumarate-FA (MULTIVITAMIN-PRENATAL) 27-0.8 MG TABS tablet Take 1 tablet by mouth daily at 12 noon.   Taking  . triamcinolone (NASACORT) 55 MCG/ACT AERO nasal inhaler Place 1 spray into the nose daily as needed (for allergies).   Taking  . vitamin B-12 (CYANOCOBALAMIN) 500 MCG tablet Take 500 mcg by mouth daily.   Taking    Review of Systems  Gastrointestinal: Positive for abdominal pain.  Genitourinary:       Vaginal bleeding   Physical Exam   Blood pressure 110/67, pulse 73,  temperature 98 F (36.7 C), temperature source Oral, resp. rate 18, last menstrual period 12/26/2014.  Physical Exam  Nursing note and vitals reviewed. Constitutional: She is oriented to person, place, and time. She appears well-developed and well-nourished. No distress.  HENT:  Head: Normocephalic and atraumatic.  Respiratory: Effort normal. No respiratory distress.  GI: Soft. There is no tenderness.  Genitourinary:  Dark brown small clots and pooled in vagina. Cervix appears closed  Neurological: She is alert and oriented to person, place, and time.  Skin: Skin is warm and dry.  Psychiatric: She has a normal mood and affect. Her behavior is normal. Judgment and thought content normal.    MAU Course  Procedures  MDM Will obtain ultrasound and labs for RH status; Pt is to follow up in office this week per Dr. Henderson Cloud. Results for orders placed or performed during the hospital encounter of 03/16/15 (from the past 24 hour(s))  Urinalysis, Routine w reflex microscopic (not at [facility])     Status: Abnormal   Collection Time: 03/16/15  9:05 AM  Result Value Ref Range   Color, Urine YELLOW YELLOW   APPearance CLEAR CLEAR   Specific Gravity, Urine 1.020 1.005 - 1.030   pH 8.5 (H) 5.0 - 8.0   Glucose, UA NEGATIVE  NEGATIVE mg/dL   Hgb urine dipstick SMALL (A) NEGATIVE   Bilirubin Urine NEGATIVE NEGATIVE   Ketones, ur NEGATIVE NEGATIVE mg/dL   Protein, ur NEGATIVE NEGATIVE mg/dL   Urobilinogen, UA 0.2 0.0 - 1.0 mg/dL   Nitrite NEGATIVE NEGATIVE   Leukocytes, UA NEGATIVE NEGATIVE  Urine microscopic-add on     Status: None   Collection Time: 03/16/15  9:05 AM  Result Value Ref Range   Squamous Epithelial / LPF RARE RARE   RBC / HPF  <3 RBC/hpf    NO FORMED ELEMENTS SEEN ON URINE MICROSCOPIC EXAMINATION   Bacteria, UA RARE RARE   Urine-Other MUCOUS PRESENT   ABO/Rh     Status: None (Preliminary result)   Collection Time: 03/16/15  9:42 AM  Result Value Ref Range   ABO/RH(D) A POS    CBC     Status: Abnormal   Collection Time: 03/16/15  9:42 AM  Result Value Ref Range   WBC 7.9 4.0 - 10.5 K/uL   RBC 3.92 3.87 - 5.11 MIL/uL   Hemoglobin 11.3 (L) 12.0 - 15.0 g/dL   HCT 16.133.6 (L) 09.636.0 - 04.546.0 %   MCV 85.7 78.0 - 100.0 fL   MCH 28.8 26.0 - 34.0 pg   MCHC 33.6 30.0 - 36.0 g/dL   RDW 40.913.3 81.111.5 - 91.415.5 %   Platelets 252 150 - 400 K/uL  [place] Ob Comp Less 14 Wks  03/16/2015  CLINICAL DATA:  Pregnant, bleeding EXAM: OBSTETRIC <14 WK ULTRASOUND TECHNIQUE: Transabdominal ultrasound was performed for evaluation of the gestation as well as the maternal uterus and adnexal regions. COMPARISON:  None. FINDINGS: Intrauterine gestational sac: Visualized/normal in shape. Yolk sac:  Present Embryo:  Present Cardiac Activity: Present Heart Rate: 155 bpm CRL:   58.3  mm   12 w 2 d                  US EDC: 09/26/2015 Maternal uterus/adnexae: Small subchronic hemorrhage. Bilateral ovaries within normal limits. No free fluid. IMPRESSION: Single live intrauterine gestation, with estimated gestational age [redacted] weeks 2 days by crown-rump length. Electronically Signed   By: Charline BillsSriyesh  Krishnan M.D.   On: 03/16/2015 10:07    Assessment and Plan  Bleeding in first trimester pregnancy Discharge to home  Clara Barton HospitalClemmons,[name] 03/16/2015, 9:36 AM

## 2015-09-10 ENCOUNTER — Inpatient Hospital Stay (HOSPITAL_COMMUNITY)
Admission: AD | Admit: 2015-09-10 | Discharge: 2015-09-13 | DRG: 775 | Disposition: A | Discharge status: Home / Self Care | Payer: BLUE CROSS/BLUE SHIELD | Source: Ambulatory Visit | Attending: Obstetrics and Gynecology | Admitting: Obstetrics and Gynecology

## 2015-09-10 ENCOUNTER — Encounter (HOSPITAL_COMMUNITY): Payer: Self-pay | Admitting: *Deleted

## 2015-09-10 DIAGNOSIS — O4202 Full-term premature rupture of membranes, onset of labor within 24 hours of rupture: Principal | ICD-10-CM | POA: Diagnosis present

## 2015-09-10 DIAGNOSIS — Z3A37 37 weeks gestation of pregnancy: Secondary | ICD-10-CM | POA: Diagnosis not present

## 2015-09-10 LAB — POCT FERN TEST

## 2015-09-10 LAB — CBC
HCT: 31.2 % — ABNORMAL LOW (ref 36.0–46.0)
HEMOGLOBIN: 10.4 g/dL — AB (ref 12.0–15.0)
MCH: 28.3 pg (ref 26.0–34.0)
MCHC: 33.3 g/dL (ref 30.0–36.0)
MCV: 84.8 fL (ref 78.0–100.0)
PLATELETS: 191 10*3/uL (ref 150–400)
RBC: 3.68 MIL/uL — ABNORMAL LOW (ref 3.87–5.11)
RDW: 15.2 % (ref 11.5–15.5)
WBC: 9 10*3/uL (ref 4.0–10.5)

## 2015-09-10 LAB — GROUP B STREP BY PCR: Group B strep by PCR: NEGATIVE

## 2015-09-10 LAB — TYPE AND SCREEN
ABO/RH(D): A POS
ANTIBODY SCREEN: NEGATIVE

## 2015-09-10 MED ORDER — OXYTOCIN BOLUS FROM INFUSION: 500.0000 mL | INTRAVENOUS | Status: DC

## 2015-09-10 MED ORDER — CLINDAMYCIN PHOSPHATE 900 MG/50ML IV SOLN
900.0000 mg | Freq: Three times a day (TID) | INTRAVENOUS | Status: DC
  Filled 2015-09-10 (×3): qty 50

## 2015-09-10 MED ORDER — LACTATED RINGERS IV SOLN
INTRAVENOUS | Status: DC
  Administered 2015-09-10 – 2015-09-11 (×3): via INTRAVENOUS

## 2015-09-10 MED ORDER — FLEET ENEMA 7-19 GM/118ML RE ENEM: 1.0000 | ENEMA | RECTAL | Status: DC | PRN

## 2015-09-10 MED ORDER — CITRIC ACID-SODIUM CITRATE 334-500 MG/5ML PO SOLN: 30.0000 mL | ORAL | Status: DC | PRN

## 2015-09-10 MED ORDER — ACETAMINOPHEN 325 MG PO TABS: 650.0000 mg | ORAL_TABLET | ORAL | Status: DC | PRN

## 2015-09-10 MED ORDER — OXYCODONE-ACETAMINOPHEN 5-325 MG PO TABS: 1.0000 | ORAL_TABLET | ORAL | Status: DC | PRN

## 2015-09-10 MED ORDER — LIDOCAINE HCL (PF) 1 % IJ SOLN
30.0000 mL | INTRAMUSCULAR | Status: DC | PRN
  Filled 2015-09-10: qty 30

## 2015-09-10 MED ORDER — OXYCODONE-ACETAMINOPHEN 5-325 MG PO TABS: 2.0000 | ORAL_TABLET | ORAL | Status: DC | PRN

## 2015-09-10 MED ORDER — OXYTOCIN 10 UNIT/ML IJ SOLN: 2.5000 [IU]/h | INTRAVENOUS | Status: DC

## 2015-09-10 MED ORDER — ONDANSETRON HCL 4 MG/2ML IJ SOLN: 4.0000 mg | Freq: Four times a day (QID) | INTRAMUSCULAR | Status: DC | PRN

## 2015-09-10 MED ORDER — LACTATED RINGERS IV SOLN: 500.0000 mL | INTRAVENOUS | Status: DC | PRN

## 2015-09-10 NOTE — MAU Note (Signed)
PT SAYS  SROM AT  830PM-  NO UC.   PNC-  WITH  PFW    - VE LAST WEEK - CLOSED.      DENIES HSV AND MRSA.   GBS- UNSURE

## 2015-09-11 ENCOUNTER — Encounter (HOSPITAL_COMMUNITY): Payer: Self-pay

## 2015-09-11 ENCOUNTER — Inpatient Hospital Stay (HOSPITAL_COMMUNITY): Payer: BLUE CROSS/BLUE SHIELD | Admitting: Anesthesiology

## 2015-09-11 LAB — OB RESULTS CONSOLE GBS: STREP GROUP B AG: NEGATIVE

## 2015-09-11 LAB — RPR: RPR Ser Ql: NONREACTIVE

## 2015-09-11 MED ORDER — IBUPROFEN 600 MG PO TABS
600.0000 mg | ORAL_TABLET | Freq: Four times a day (QID) | ORAL | Status: DC
  Administered 2015-09-11 – 2015-09-13 (×6): 600 mg via ORAL
  Filled 2015-09-11 (×6): qty 1

## 2015-09-11 MED ORDER — SENNOSIDES-DOCUSATE SODIUM 8.6-50 MG PO TABS
2.0000 | ORAL_TABLET | ORAL | Status: DC
  Administered 2015-09-11 – 2015-09-13 (×2): 2 via ORAL
  Filled 2015-09-11 (×2): qty 2

## 2015-09-11 MED ORDER — EPHEDRINE 5 MG/ML INJ
10.0000 mg | INTRAVENOUS | Status: DC | PRN
  Filled 2015-09-11: qty 2

## 2015-09-11 MED ORDER — LIDOCAINE HCL (PF) 1 % IJ SOLN
INTRAMUSCULAR | Status: DC | PRN
  Administered 2015-09-11: 6 mL via EPIDURAL
  Administered 2015-09-11: 4 mL

## 2015-09-11 MED ORDER — METHYLERGONOVINE MALEATE 0.2 MG/ML IJ SOLN: 0.2000 mg | INTRAMUSCULAR | Status: DC | PRN

## 2015-09-11 MED ORDER — DIPHENHYDRAMINE HCL 50 MG/ML IJ SOLN: 12.5000 mg | INTRAMUSCULAR | Status: DC | PRN

## 2015-09-11 MED ORDER — BUTORPHANOL TARTRATE 1 MG/ML IJ SOLN
1.0000 mg | INTRAMUSCULAR | Status: DC | PRN
  Administered 2015-09-11 (×2): 1 mg via INTRAVENOUS
  Filled 2015-09-11 (×2): qty 1

## 2015-09-11 MED ORDER — PHENYLEPHRINE 40 MCG/ML (10ML) SYRINGE FOR IV PUSH (FOR BLOOD PRESSURE SUPPORT)
80.0000 ug | PREFILLED_SYRINGE | INTRAVENOUS | Status: DC | PRN
  Filled 2015-09-11: qty 5

## 2015-09-11 MED ORDER — LACTATED RINGERS IV SOLN: 500.0000 mL | Freq: Once | INTRAVENOUS | Status: DC

## 2015-09-11 MED ORDER — SIMETHICONE 80 MG PO CHEW: 80.0000 mg | CHEWABLE_TABLET | ORAL | Status: DC | PRN

## 2015-09-11 MED ORDER — FENTANYL 2.5 MCG/ML BUPIVACAINE 1/10 % EPIDURAL INFUSION (WH - ANES)
14.0000 mL/h | INTRAMUSCULAR | Status: DC | PRN
  Administered 2015-09-11 (×2): 14 mL/h via EPIDURAL
  Filled 2015-09-11 (×2): qty 125

## 2015-09-11 MED ORDER — DIBUCAINE 1 % RE OINT: 1.0000 "application " | TOPICAL_OINTMENT | RECTAL | Status: DC | PRN

## 2015-09-11 MED ORDER — TERBUTALINE SULFATE 1 MG/ML IJ SOLN
0.2500 mg | Freq: Once | INTRAMUSCULAR | Status: DC | PRN
  Filled 2015-09-11: qty 1

## 2015-09-11 MED ORDER — MEDROXYPROGESTERONE ACETATE 150 MG/ML IM SUSP: 150.0000 mg | INTRAMUSCULAR | Status: DC | PRN

## 2015-09-11 MED ORDER — BENZOCAINE-MENTHOL 20-0.5 % EX AERO
1.0000 | INHALATION_SPRAY | CUTANEOUS | Status: DC | PRN
Start: 2015-09-11 — End: 2015-09-13

## 2015-09-11 MED ORDER — COCONUT OIL OIL: 1.0000 "application " | TOPICAL_OIL | Status: DC | PRN

## 2015-09-11 MED ORDER — PRENATAL MULTIVITAMIN CH
1.0000 | ORAL_TABLET | Freq: Every day | ORAL | Status: DC
  Administered 2015-09-12: 1 via ORAL
  Filled 2015-09-11: qty 1

## 2015-09-11 MED ORDER — TETANUS-DIPHTH-ACELL PERTUSSIS 5-2.5-18.5 LF-MCG/0.5 IM SUSP: 0.5000 mL | Freq: Once | INTRAMUSCULAR | Status: DC

## 2015-09-11 MED ORDER — ONDANSETRON HCL 4 MG/2ML IJ SOLN: 4.0000 mg | INTRAMUSCULAR | Status: DC | PRN

## 2015-09-11 MED ORDER — DIPHENHYDRAMINE HCL 25 MG PO CAPS: 25.0000 mg | ORAL_CAPSULE | Freq: Four times a day (QID) | ORAL | Status: DC | PRN

## 2015-09-11 MED ORDER — OXYTOCIN 10 UNIT/ML IJ SOLN
1.0000 m[IU]/min | INTRAVENOUS | Status: DC
  Administered 2015-09-11: 2 m[IU]/min via INTRAVENOUS
  Filled 2015-09-11: qty 4

## 2015-09-11 MED ORDER — METHYLERGONOVINE MALEATE 0.2 MG PO TABS: 0.2000 mg | ORAL_TABLET | ORAL | Status: DC | PRN

## 2015-09-11 MED ORDER — ZOLPIDEM TARTRATE 5 MG PO TABS
5.0000 mg | ORAL_TABLET | Freq: Every evening | ORAL | Status: DC | PRN
Start: 2015-09-11 — End: 2015-09-13

## 2015-09-11 MED ORDER — ACETAMINOPHEN 325 MG PO TABS: 650.0000 mg | ORAL_TABLET | ORAL | Status: DC | PRN

## 2015-09-11 MED ORDER — OXYCODONE-ACETAMINOPHEN 5-325 MG PO TABS
1.0000 | ORAL_TABLET | ORAL | Status: DC | PRN
Start: 2015-09-11 — End: 2015-09-13

## 2015-09-11 MED ORDER — OXYCODONE-ACETAMINOPHEN 5-325 MG PO TABS: 2.0000 | ORAL_TABLET | ORAL | Status: DC | PRN

## 2015-09-11 MED ORDER — PHENYLEPHRINE 40 MCG/ML (10ML) SYRINGE FOR IV PUSH (FOR BLOOD PRESSURE SUPPORT)
80.0000 ug | PREFILLED_SYRINGE | INTRAVENOUS | Status: DC | PRN
  Filled 2015-09-11: qty 10
  Filled 2015-09-11: qty 5

## 2015-09-11 MED ORDER — MEASLES, MUMPS & RUBELLA VAC ~~LOC~~ INJ
0.5000 mL | INJECTION | Freq: Once | SUBCUTANEOUS | Status: DC
  Filled 2015-09-11: qty 0.5

## 2015-09-11 MED ORDER — WITCH HAZEL-GLYCERIN EX PADS
1.0000 | MEDICATED_PAD | CUTANEOUS | Status: DC | PRN
Start: 2015-09-11 — End: 2015-09-13

## 2015-09-11 MED ORDER — ONDANSETRON HCL 4 MG PO TABS: 4.0000 mg | ORAL_TABLET | ORAL | Status: DC | PRN

## 2015-09-11 NOTE — Progress Notes (Signed)
SVD of vigorous female infant w/ apgars of 8,9.  Placenta delivered spontaneous w/ 3VC.   2nd degree lac repaired w/ 3-0 vicryl rapide.  Fundus firm.  EBL 200cc.

## 2015-09-11 NOTE — Plan of Care (Signed)
Problem: Education: Goal: Knowledge of condition will improve Outcome: Progressing Reviewed pt education resources, breast & nipple care, & immunizations

## 2015-09-11 NOTE — Anesthesia Preprocedure Evaluation (Signed)

## 2015-09-11 NOTE — Progress Notes (Signed)
Pt feeling some discomfort w/ ctx and mild pressure  FHT reassuring w/ accels Toco Q1-2 Cvx 9.5/C/+1 Small anterior lip that pushes back with valsalva Small-moderate amt vaginal bleeding  A/P:  Will start pushing

## 2015-09-11 NOTE — Anesthesia Procedure Notes (Signed)

## 2015-09-11 NOTE — H&P (Signed)
Mare LoanMarcela Andrena MewsSalazar is a 35 y.o. female presenting for SROM/SOL. History OB History    Gravida Para Term Preterm AB TAB SAB Ectopic Multiple Living   1              Past Medical History  Diagnosis Date  . Healthy adult   . Medical history non-contributory    Past Surgical History  Procedure Laterality Date  . No past surgery    . No past surgeries     Family History: family history includes Healthy in her father and mother. Social History:  reports that she has never smoked. She does not have any smokeless tobacco history on file. She reports that she drinks alcohol. She reports that she does not use illicit drugs.   Prenatal Transfer Tool  Maternal Diabetes: No Genetic Screening: Normal Maternal Ultrasounds/Referrals: Normal Fetal Ultrasounds or other Referrals:  None Maternal Substance Abuse:  No Significant Maternal Medications:  None Significant Maternal Lab Results:  None Other Comments:  None  ROS  Dilation: 3 Effacement (%): 100 Station: -1 Exam by:: Leanne Carpenter,RN Blood pressure 126/82, pulse 80, temperature 98 F (36.7 C), temperature source Oral, resp. rate 18, height 5' 0.5" (1.537 m), weight 174 lb 8 oz (79.153 kg), last menstrual period 12/26/2014. Exam Physical Exam  Gen - NAD, uncomfortable w/ epidural Abd - gravid, NT  EFW 7# Ext - NT, 1+ edema bilat Cvx 3cm/C/-1  Prenatal labs: ABO, Rh: --/--/A POS (05/09 2209) Antibody: NEG (05/09 2209) Rubella: Immune (10/31 0000) RPR: Nonreactive (10/31 0000)  HBsAg: Negative (10/31 0000)  HIV: Non-reactive (10/31 0000)  GBS: Negative (05/10 0000)   Assessment/Plan: Admit Epidural prn Exp mngt   Decklyn Hornik 09/11/2015, 7:47 AM

## 2015-09-12 LAB — CBC
HEMATOCRIT: 22.7 % — AB (ref 36.0–46.0)
Hemoglobin: 7.6 g/dL — ABNORMAL LOW (ref 12.0–15.0)
MCH: 28.3 pg (ref 26.0–34.0)
MCHC: 33.5 g/dL (ref 30.0–36.0)
MCV: 84.4 fL (ref 78.0–100.0)
Platelets: 177 10*3/uL (ref 150–400)
RBC: 2.69 MIL/uL — ABNORMAL LOW (ref 3.87–5.11)
RDW: 15.6 % — AB (ref 11.5–15.5)
WBC: 11.6 10*3/uL — AB (ref 4.0–10.5)

## 2015-09-12 MED ORDER — FERROUS SULFATE 325 (65 FE) MG PO TABS
325.0000 mg | ORAL_TABLET | Freq: Three times a day (TID) | ORAL | Status: DC
  Administered 2015-09-12 – 2015-09-13 (×3): 325 mg via ORAL
  Filled 2015-09-12 (×3): qty 1

## 2015-09-12 NOTE — Anesthesia Postprocedure Evaluation (Signed)
Anesthesia Post Note  Patient: [name]  Procedure(s) Performed: * No procedures listed *  Patient location during evaluation: Mother Baby Anesthesia Type: Epidural Level of consciousness: oriented and awake and alert Pain management: pain level controlled Vital Signs Assessment: post-procedure vital signs reviewed and stable Respiratory status: spontaneous breathing and nonlabored ventilation Cardiovascular status: stable Postop Assessment: epidural receding, patient able to bend at knees, no signs of nausea or vomiting and adequate PO intake Anesthetic complications: no     Last Vitals:  Filed Vitals:   09/11/15 2359 09/12/15 0559  BP: 113/64 108/65  Pulse: 80 77  Temp: 37.2 C 36.6 C  Resp: 18 18    Last Pain:  Filed Vitals:   09/12/15 0600  PainSc: 0-No pain   Pain Goal: Patients Stated Pain Goal: 2 (09/11/15 1434)               Leviathan Macera Hristova

## 2015-09-12 NOTE — Lactation Note (Signed)
This note was copied from a baby's chart. Lactation Consultation Note New mom is breast and formula. Baby is sleepy, spitty, and no interest in BF at this time. Mom has all ready asked for formula d/t only has colostrum. LC explained to mom that it comes first d/t importance. Not sure of committment of  BF. Mom encouraged to feed baby 8-12 times/24 hours and with feeding cues. Referred to Baby and Me Book in Breastfeeding section Pg. 22-23 for position options and Proper latch demonstration. Educated about newborn behavior of 2537 weeker and LPI information sheet reviewed. WH/LC brochure given w/resources, support groups and LC services. Patient Name: Selena Villanueva [id number]'s Date: 09/12/2015 Reason for consult: Initial assessment   Maternal Data Has patient been taught Hand Expression?: Yes Does the patient have breastfeeding experience prior to this delivery?: No  Feeding Feeding Type: Breast Fed Length of feed: 0 min  LATCH Score/Interventions Latch: Too sleepy or reluctant, no latch achieved, no sucking elicited. Intervention(s): Skin to skin;Teach feeding cues;Waking techniques     Type of Nipple: Everted at rest and after stimulation  Comfort (Breast/Nipple): Soft / non-tender     Intervention(s): Skin to skin;Position options;Support Pillows;Breastfeeding basics reviewed     Lactation Tools Discussed/Used WIC Program: No   Consult Status Consult Status: Follow-up Date: 09/12/15 Follow-up type: In-patient    Charyl DancerCARVER, Khizar Fiorella G 09/12/2015, 6:33 AM

## 2015-09-12 NOTE — Progress Notes (Signed)
Post Partum Day 1 Subjective: no complaints, up ad lib, voiding and tolerating PO  Objective: Blood pressure 108/65, pulse 77, temperature 97.9 F (36.6 C), temperature source Oral, resp. rate 18, height 5' 0.5" (1.537 m), weight 174 lb 8 oz (79.153 kg), last menstrual period 12/26/2014, unknown if currently breastfeeding.  Physical Exam:  General: alert and cooperative Lochia: appropriate Uterine Fundus: firm Incision: healing well DVT Evaluation: No evidence of DVT seen on physical exam. Negative Homan's sign. No cords or calf tenderness. No significant calf/ankle edema.   Recent Labs  09/10/15 2209 09/12/15 0542  HGB 10.4* 7.6*  HCT 31.2* 22.7*    Assessment/Plan: Plan for discharge tomorrow Feso4 tid  LOS: 2 days   Dillyn Menna G 09/12/2015, 8:19 AM

## 2015-09-13 MED ORDER — FERROUS SULFATE 325 (65 FE) MG PO TABS: 325.0000 mg | ORAL_TABLET | Freq: Three times a day (TID) | ORAL | Status: AC

## 2015-09-13 NOTE — Lactation Note (Signed)
This note was copied from a baby's chart. Lactation Consultation Note  Patient Name: Selena Villanueva AVWUJ'WToday's Date: 09/13/2015 Reason for consult: Follow-up assessment;Late preterm infant   Follow up with mom of 38 hour old infant. Infant with 2 Bf for 10 min, 2 attempts, 6 formula feeds of 10-15 cc via bottle, 5 voids and 2 stools in 24 hours preceding this assessment. Infant weight 6 lb 1.3 oz with weight loss of 5%. LATCH Scores 6 by bedside RN. Infant noted to have jaundiced skin appearance and serum bilirubin 9.5 @ 36 hours of age.  Mom reports she would like to BF and is attempting and notes infant is sleepy at the breast. Asked mom about beginning pumping and using SNS ans she agreed to both. Infant was awake and cueing to feed. We attached 5 fr feeding tube to left breast and latched infant in cross cradle hold. Infant nursed for 10 minutes and took 10 cc formula via feeding tube at breast. He was then burped and diaper changed and was latched to right breast in football hold, he took an additional 4 cc vis 5 fr feeding tube at breast. Encouraged mom to use awakening techniques at breast as needed to keep infant feeding.  I showed mom how to finger feed infant, however he did not transfer much milk in this manner, advised mom to supplement with bottle post BF with 5 fr feeding tube at breast if he does not take the desired amount. Advised mom to begin increasing supplemental feeds with each feeding and working him up to 20-30 cc/feeding. Mom voiced understanding.   Mom with small firm breasts and small short shafted nipples. She reports she does not feel fuller today. She is concerned she does not have enough to feed infant. Reviewed 37 week infant behaviors with mom in regards to his sleepiness and not feeding well at the breast. Enc her to offer the breast with each feeding and follow with supplementation. Infant was sleepy during and after feeding. Mom did well with latching infant to breast.    Set up DEBP for mom with instructions for set up, assembling, disassembling, cleaning, milk storage, and pumping. Mom voiced understanding.   Plan made with mom and written on her board in her room:  BF infant 8-12 x in 24 hours at first feeding cues, no longer than 3 hours between feeds, use 5 fr feeding tube to supplement at breast.  Supplement with EBM/Formula at breast and with bottle as needed after BF Pump for 15 minutes on Initiate setting with DEBP Hand Express post pumping and save EBM for next feeding. Call for assistance as needed.   Mom has a Spectra pump at home for use.        Maternal Data Has patient been taught Hand Expression?: Yes Does the patient have breastfeeding experience prior to this delivery?: No  Feeding Feeding Type: Breast Fed Length of feed: 15 min  LATCH Score/Interventions Latch: Repeated attempts needed to sustain latch, nipple held in mouth throughout feeding, stimulation needed to elicit sucking reflex. Intervention(s): Skin to skin;Teach feeding cues;Waking techniques Intervention(s): Adjust position;Assist with latch;Breast massage;Breast compression  Audible Swallowing: Spontaneous and intermittent (With SNS at breast) Intervention(s): Skin to skin;Hand expression  Type of Nipple: Everted at rest and after stimulation  Comfort (Breast/Nipple): Filling, red/small blisters or bruises, mild/mod discomfort  Problem noted: Mild/Moderate discomfort (Mainly with latch) Interventions (Mild/moderate discomfort): Hand expression  Hold (Positioning): Assistance needed to correctly position infant at breast and maintain  latch. Intervention(s): Breastfeeding basics reviewed;Support Pillows;Position options;Skin to skin  LATCH Score: 7  Lactation Tools Discussed/Used Tools: Supplemental Nutrition System;Bottle Pump Review: Setup, frequency, and cleaning;Milk Storage Initiated by:: Noralee Stain, RN, IBCLC Date initiated::  09/13/15   Consult Status Consult Status: Follow-up Date: 09/14/15 Follow-up type: In-patient    Silas Flood Arben Packman 09/13/2015, 9:06 AM

## 2015-09-13 NOTE — Discharge Summary (Signed)
Obstetric Discharge Summary Reason for Admission: rupture of membranes Prenatal Procedures: ultrasound Intrapartum Procedures: spontaneous vaginal delivery Postpartum Procedures: none Complications-Operative and Postpartum: 1 degree perineal laceration HEMOGLOBIN  Date Value Ref Range Status  09/12/2015 7.6* 12.0 - 15.0 g/dL Final    Comment:    DELTA CHECK NOTED REPEATED TO VERIFY    HCT  Date Value Ref Range Status  09/12/2015 22.7* 36.0 - 46.0 % Final    Physical Exam:  General: alert and cooperative Lochia: appropriate Uterine Fundus: firm Incision: healing well DVT Evaluation: No evidence of DVT seen on physical exam. Negative Homan's sign. No cords or calf tenderness. Calf/Ankle edema is present.  Discharge Diagnoses: Term Pregnancy-delivered  Discharge Information: Date: 09/13/2015 Activity: pelvic rest Diet: routine Medications: PNV and Ibuprofen Condition: stable Instructions: refer to practice specific booklet Discharge to: home   Newborn Data: Live born female  Birth Weight: 6 lb 8.2 oz (2955 g) APGAR: 8, 9  Home with mother.   Selena Villanueva G 09/13/2015, 8:17 AM

## 2015-09-14 ENCOUNTER — Ambulatory Visit: Payer: Self-pay

## 2015-09-14 NOTE — Lactation Note (Signed)
This note was copied from a baby's chart. Lactation Consultation Note  Follow up visit made.  Mom states no breast changes noticed and no milk obtained when pumped last.  Reassured her that milk coming to volume can take 3-5 days.  Instructed on engorgement treatment.  Mom comfortable with using SNS and bottle for supplementing.  Instructed to continue pumping every 3 hours at home and supplementation until milk is in and baby actively nursing.  Outpatient lactation services and support reviewed and encouraged prn.  Patient Name: Selena Harriet PhoMarcela Lafferty [id number]'s Date: 09/14/2015     Maternal Data Has patient been taught Hand Expression?: Yes Does the patient have breastfeeding experience prior to this delivery?: No  Feeding Feeding Type: Formula Nipple Type: Slow - flow  LATCH Score/Interventions                      Lactation Tools Discussed/Used     Consult Status      Huston FoleyMOULDEN, Dennard Vezina S 09/14/2015, 10:46 AM

## 2018-05-04 NOTE — L&D Delivery Note (Signed)
Operative Delivery Note At 6:42 PM a viable female was delivered via .  Presentation: vertex; Position: Right,, Occiput,, Anterior; Station: +2.  Verbal consent: obtained from patient.  Risks and benefits discussed in detail.  Risks include, but are not limited to the risks of anesthesia, bleeding, infection, damage to maternal tissues, fetal cephalhematoma.  There is also the risk of inability to effect vaginal delivery of the head, or shoulder dystocia that cannot be resolved by established maneuvers, leading to the need for emergency cesarean section.  APGAR: 8 9 weight pending .   Placenta status: spontaneously with 3 vessel cord , .   Cord:  with the following complications:tight nuchal cord x 1  .  Cord pH: not obtained  Anesthesia:  epidural Instruments: mushroom delivered easily with 3 pulls and no popoffs Episiotomy:  none Lacerations:  2nd  Suture Repair: 3.0 chromic Est. Blood Loss (mL):  300  Mom to postpartum.  Baby to Couplet care / Skin to Skin.  Cyril Mourning 03/11/2019, 7:04 PM

## 2018-08-23 LAB — OB RESULTS CONSOLE RUBELLA ANTIBODY, IGM: Rubella: IMMUNE

## 2018-08-23 LAB — OB RESULTS CONSOLE RPR: RPR: NONREACTIVE

## 2018-08-23 LAB — OB RESULTS CONSOLE GC/CHLAMYDIA
Chlamydia: NEGATIVE
Gonorrhea: NEGATIVE

## 2018-08-23 LAB — OB RESULTS CONSOLE HIV ANTIBODY (ROUTINE TESTING): HIV: NONREACTIVE

## 2018-08-23 LAB — OB RESULTS CONSOLE HEPATITIS B SURFACE ANTIGEN: Hepatitis B Surface Ag: NEGATIVE

## 2019-02-14 LAB — OB RESULTS CONSOLE GBS: GBS: NEGATIVE

## 2019-03-11 ENCOUNTER — Encounter (HOSPITAL_COMMUNITY): Payer: Self-pay

## 2019-03-11 ENCOUNTER — Inpatient Hospital Stay (HOSPITAL_COMMUNITY)
Admission: AD | Admit: 2019-03-11 | Discharge: 2019-03-13 | DRG: 807 | Disposition: A | Discharge status: Home / Self Care | Payer: BC Managed Care – PPO | Attending: Obstetrics and Gynecology | Admitting: Obstetrics and Gynecology

## 2019-03-11 ENCOUNTER — Other Ambulatory Visit: Payer: Self-pay

## 2019-03-11 ENCOUNTER — Inpatient Hospital Stay (HOSPITAL_COMMUNITY): Payer: BC Managed Care – PPO | Admitting: Anesthesiology

## 2019-03-11 DIAGNOSIS — Z3A39 39 weeks gestation of pregnancy: Secondary | ICD-10-CM

## 2019-03-11 DIAGNOSIS — Z20828 Contact with and (suspected) exposure to other viral communicable diseases: Secondary | ICD-10-CM | POA: Diagnosis present

## 2019-03-11 DIAGNOSIS — O26893 Other specified pregnancy related conditions, third trimester: Secondary | ICD-10-CM | POA: Diagnosis present

## 2019-03-11 LAB — CBC
HCT: 34.3 % — ABNORMAL LOW (ref 36.0–46.0)
Hemoglobin: 11.3 g/dL — ABNORMAL LOW (ref 12.0–15.0)
MCH: 28.1 pg (ref 26.0–34.0)
MCHC: 32.9 g/dL (ref 30.0–36.0)
MCV: 85.3 fL (ref 80.0–100.0)
Platelets: 244 10*3/uL (ref 150–400)
RBC: 4.02 MIL/uL (ref 3.87–5.11)
RDW: 14.5 % (ref 11.5–15.5)
WBC: 10.5 10*3/uL (ref 4.0–10.5)
nRBC: 0 % (ref 0.0–0.2)

## 2019-03-11 LAB — TYPE AND SCREEN
ABO/RH(D): A POS
Antibody Screen: NEGATIVE

## 2019-03-11 LAB — POCT FERN TEST: POCT Fern Test: POSITIVE

## 2019-03-11 LAB — ABO/RH: ABO/RH(D): A POS

## 2019-03-11 LAB — SARS CORONAVIRUS 2 (TAT 6-24 HRS): SARS Coronavirus 2: NEGATIVE

## 2019-03-11 MED ORDER — ONDANSETRON HCL 4 MG/2ML IJ SOLN: 4.0000 mg | INTRAMUSCULAR | Status: DC | PRN

## 2019-03-11 MED ORDER — SENNOSIDES-DOCUSATE SODIUM 8.6-50 MG PO TABS
2.0000 | ORAL_TABLET | ORAL | Status: DC
  Administered 2019-03-11 – 2019-03-12 (×2): 2 via ORAL
  Filled 2019-03-11 (×2): qty 2

## 2019-03-11 MED ORDER — LIDOCAINE HCL (PF) 1 % IJ SOLN: 30.0000 mL | INTRAMUSCULAR | Status: DC | PRN

## 2019-03-11 MED ORDER — LACTATED RINGERS IV SOLN
500.0000 mL | Freq: Once | INTRAVENOUS | Status: AC
  Administered 2019-03-11: 16:00:00 via INTRAVENOUS

## 2019-03-11 MED ORDER — DIBUCAINE (PERIANAL) 1 % EX OINT: 1.0000 "application " | TOPICAL_OINTMENT | CUTANEOUS | Status: DC | PRN

## 2019-03-11 MED ORDER — MEASLES, MUMPS & RUBELLA VAC IJ SOLR: 0.5000 mL | Freq: Once | INTRAMUSCULAR | Status: DC

## 2019-03-11 MED ORDER — OXYCODONE-ACETAMINOPHEN 5-325 MG PO TABS: 2.0000 | ORAL_TABLET | ORAL | Status: DC | PRN

## 2019-03-11 MED ORDER — PHENYLEPHRINE 40 MCG/ML (10ML) SYRINGE FOR IV PUSH (FOR BLOOD PRESSURE SUPPORT): 80.0000 ug | PREFILLED_SYRINGE | INTRAVENOUS | Status: DC | PRN

## 2019-03-11 MED ORDER — FENTANYL-BUPIVACAINE-NACL 0.5-0.125-0.9 MG/250ML-% EP SOLN
12.0000 mL/h | EPIDURAL | Status: DC | PRN
  Filled 2019-03-11: qty 250

## 2019-03-11 MED ORDER — EPHEDRINE 5 MG/ML INJ: 10.0000 mg | INTRAVENOUS | Status: DC | PRN

## 2019-03-11 MED ORDER — ACETAMINOPHEN 325 MG PO TABS: 650.0000 mg | ORAL_TABLET | ORAL | Status: DC | PRN

## 2019-03-11 MED ORDER — FLEET ENEMA 7-19 GM/118ML RE ENEM: 1.0000 | ENEMA | RECTAL | Status: DC | PRN

## 2019-03-11 MED ORDER — TERBUTALINE SULFATE 1 MG/ML IJ SOLN: 0.2500 mg | Freq: Once | INTRAMUSCULAR | Status: DC | PRN

## 2019-03-11 MED ORDER — BUTORPHANOL TARTRATE 1 MG/ML IJ SOLN
1.0000 mg | Freq: Once | INTRAMUSCULAR | Status: AC
  Administered 2019-03-11: 1 mg via INTRAVENOUS
  Filled 2019-03-11: qty 1

## 2019-03-11 MED ORDER — OXYTOCIN 40 UNITS IN NORMAL SALINE INFUSION - SIMPLE MED
1.0000 m[IU]/min | INTRAVENOUS | Status: DC
  Administered 2019-03-11: 2 m[IU]/min via INTRAVENOUS

## 2019-03-11 MED ORDER — OXYCODONE-ACETAMINOPHEN 5-325 MG PO TABS: 1.0000 | ORAL_TABLET | ORAL | Status: DC | PRN

## 2019-03-11 MED ORDER — COCONUT OIL OIL
1.0000 "application " | TOPICAL_OIL | Status: DC | PRN
  Administered 2019-03-13: 1 via TOPICAL

## 2019-03-11 MED ORDER — PHENYLEPHRINE 40 MCG/ML (10ML) SYRINGE FOR IV PUSH (FOR BLOOD PRESSURE SUPPORT)
80.0000 ug | PREFILLED_SYRINGE | INTRAVENOUS | Status: DC | PRN
  Filled 2019-03-11: qty 10

## 2019-03-11 MED ORDER — DIPHENHYDRAMINE HCL 50 MG/ML IJ SOLN: 12.5000 mg | INTRAMUSCULAR | Status: DC | PRN

## 2019-03-11 MED ORDER — ONDANSETRON HCL 4 MG PO TABS: 4.0000 mg | ORAL_TABLET | ORAL | Status: DC | PRN

## 2019-03-11 MED ORDER — WITCH HAZEL-GLYCERIN EX PADS: 1.0000 "application " | MEDICATED_PAD | CUTANEOUS | Status: DC | PRN

## 2019-03-11 MED ORDER — SOD CITRATE-CITRIC ACID 500-334 MG/5ML PO SOLN: 30.0000 mL | ORAL | Status: DC | PRN

## 2019-03-11 MED ORDER — BISACODYL 10 MG RE SUPP: 10.0000 mg | Freq: Every day | RECTAL | Status: DC | PRN

## 2019-03-11 MED ORDER — FLEET ENEMA 7-19 GM/118ML RE ENEM: 1.0000 | ENEMA | Freq: Every day | RECTAL | Status: DC | PRN

## 2019-03-11 MED ORDER — IBUPROFEN 600 MG PO TABS
600.0000 mg | ORAL_TABLET | Freq: Four times a day (QID) | ORAL | Status: DC
  Administered 2019-03-11 – 2019-03-13 (×7): 600 mg via ORAL
  Filled 2019-03-11 (×7): qty 1

## 2019-03-11 MED ORDER — TETANUS-DIPHTH-ACELL PERTUSSIS 5-2.5-18.5 LF-MCG/0.5 IM SUSP: 0.5000 mL | Freq: Once | INTRAMUSCULAR | Status: DC

## 2019-03-11 MED ORDER — MEDROXYPROGESTERONE ACETATE 150 MG/ML IM SUSP: 150.0000 mg | INTRAMUSCULAR | Status: DC | PRN

## 2019-03-11 MED ORDER — LACTATED RINGERS IV SOLN
INTRAVENOUS | Status: DC
  Administered 2019-03-11 (×3): via INTRAVENOUS

## 2019-03-11 MED ORDER — DIPHENHYDRAMINE HCL 25 MG PO CAPS: 25.0000 mg | ORAL_CAPSULE | Freq: Four times a day (QID) | ORAL | Status: DC | PRN

## 2019-03-11 MED ORDER — SIMETHICONE 80 MG PO CHEW: 80.0000 mg | CHEWABLE_TABLET | ORAL | Status: DC | PRN

## 2019-03-11 MED ORDER — PRENATAL MULTIVITAMIN CH
1.0000 | ORAL_TABLET | Freq: Every day | ORAL | Status: DC
  Administered 2019-03-12 – 2019-03-13 (×2): 1 via ORAL
  Filled 2019-03-11 (×2): qty 1

## 2019-03-11 MED ORDER — ACETAMINOPHEN 325 MG PO TABS
650.0000 mg | ORAL_TABLET | ORAL | Status: DC | PRN
  Administered 2019-03-12: 650 mg via ORAL
  Filled 2019-03-11: qty 2

## 2019-03-11 MED ORDER — ONDANSETRON HCL 4 MG/2ML IJ SOLN: 4.0000 mg | Freq: Four times a day (QID) | INTRAMUSCULAR | Status: DC | PRN

## 2019-03-11 MED ORDER — BENZOCAINE-MENTHOL 20-0.5 % EX AERO
1.0000 "application " | INHALATION_SPRAY | CUTANEOUS | Status: DC | PRN
  Administered 2019-03-11 – 2019-03-12 (×2): 1 via TOPICAL
  Filled 2019-03-11 (×2): qty 56

## 2019-03-11 MED ORDER — OXYTOCIN BOLUS FROM INFUSION
500.0000 mL | Freq: Once | INTRAVENOUS | Status: AC
  Administered 2019-03-11: 19:00:00 500 mL/h via INTRAVENOUS

## 2019-03-11 MED ORDER — LACTATED RINGERS IV SOLN: 500.0000 mL | INTRAVENOUS | Status: DC | PRN

## 2019-03-11 MED ORDER — LIDOCAINE HCL (PF) 1 % IJ SOLN
INTRAMUSCULAR | Status: DC | PRN
  Administered 2019-03-11 (×2): 6 mL via EPIDURAL

## 2019-03-11 MED ORDER — ZOLPIDEM TARTRATE 5 MG PO TABS: 5.0000 mg | ORAL_TABLET | Freq: Every evening | ORAL | Status: DC | PRN

## 2019-03-11 MED ORDER — SODIUM CHLORIDE (PF) 0.9 % IJ SOLN
INTRAMUSCULAR | Status: DC | PRN
  Administered 2019-03-11: 12 mL/h via EPIDURAL

## 2019-03-11 MED ORDER — OXYTOCIN 40 UNITS IN NORMAL SALINE INFUSION - SIMPLE MED
2.5000 [IU]/h | INTRAVENOUS | Status: DC
  Administered 2019-03-11: 2.5 [IU]/h via INTRAVENOUS
  Filled 2019-03-11: qty 1000

## 2019-03-11 NOTE — Progress Notes (Signed)
Mom states that she is too tired to pump tonight. Set up pump and reviewed cleaning and setup instructions. Educated patient on the importance of stimulating the breast since newborn is in NICU. Mother verbalized understanding.

## 2019-03-11 NOTE — MAU Note (Signed)
Selena Villanueva is a 38 y.o. at [redacted]w[redacted]d here in MAU reporting: started trickling fluid about 1.5 hours ago, states trickling is increasing. Fluid is clear/yellow. Having some lower abdominal and back cramping. No bleeding. +FM  Onset of complaint: today within the past 1.5 hours  Pain score: 2/10  Vitals:   03/11/19 1128  BP: 127/81  Pulse: (!) 101  Resp: 16  Temp: 98.4 F (36.9 C)  SpO2: 99%      Lab orders placed from triage: none

## 2019-03-11 NOTE — Anesthesia Preprocedure Evaluation (Signed)
Anesthesia Evaluation  Patient identified by MRN, date of birth, ID band Patient awake    Reviewed: Allergy & Precautions, H&P , NPO status , Patient's Chart, lab work & pertinent test results  Airway Mallampati: I  TM Distance: >3 FB Neck ROM: full    Dental no notable dental hx. (+) Teeth Intact   Pulmonary neg pulmonary ROS,    Pulmonary exam normal breath sounds clear to auscultation       Cardiovascular negative cardio ROS Normal cardiovascular exam Rhythm:regular Rate:Normal     Neuro/Psych negative neurological ROS  negative psych ROS   GI/Hepatic negative GI ROS, Neg liver ROS,   Endo/Other  negative endocrine ROS  Renal/GU negative Renal ROS  negative genitourinary   Musculoskeletal negative musculoskeletal ROS (+)   Abdominal Normal abdominal exam  (+)   Peds  Hematology negative hematology ROS (+)   Anesthesia Other Findings   Reproductive/Obstetrics (+) Pregnancy                             Anesthesia Physical Anesthesia Plan  ASA: II  Anesthesia Plan: Epidural   Post-op Pain Management:    Induction:   PONV Risk Score and Plan:   Airway Management Planned:   Additional Equipment:   Intra-op Plan:   Post-operative Plan:   Informed Consent: I have reviewed the patients History and Physical, chart, labs and discussed the procedure including the risks, benefits and alternatives for the proposed anesthesia with the patient or authorized representative who has indicated his/her understanding and acceptance.       Plan Discussed with:   Anesthesia Plan Comments:         Anesthesia Quick Evaluation  

## 2019-03-11 NOTE — Anesthesia Procedure Notes (Signed)
Epidural Patient location during procedure: OB Start time: 03/11/2019 4:22 PM End time: 03/11/2019 4:25 PM  Staffing Anesthesiologist: Lyn Hollingshead, MD Performed: anesthesiologist   Preanesthetic Checklist Completed: patient identified, site marked, surgical consent, pre-op evaluation, timeout performed, IV checked, risks and benefits discussed and monitors and equipment checked  Epidural Patient position: sitting Prep: site prepped and draped and DuraPrep Patient monitoring: continuous pulse ox and blood pressure Approach: midline Location: L3-L4 Injection technique: LOR air  Needle:  Needle type: Tuohy  Needle gauge: 17 G Needle length: 9 cm and 9 Needle insertion depth: 6 cm Catheter type: closed end flexible Catheter size: 19 Gauge Catheter at skin depth: 11 cm Test dose: negative and Other  Assessment Events: blood not aspirated, injection not painful, no injection resistance, negative IV test and no paresthesia  Additional Notes Reason for block:procedure for pain

## 2019-03-11 NOTE — Consult Note (Signed)
Delivery Note:  SVD    03/11/2019  6:42 PM  I was called to the delivery room at the request of the patient's obstetrician (Dr. Helane Rima) for vacuum assistance.  PRENATAL HX:  This is a 38 y/o G2P1001 at 87 and 2/[redacted] weeks gestation who was admitted for SROM and labor.  ROM x9 hours, GBS negative  No pregnancy complications.  Delivery assisted by vacuum.  Tight nuchal cord noted at delivery.    DELIVERY:  Infant had poor tone at delivery, so cord clamping was not delayed.  He was taken to the warmer and HR was > 100 and he began to cry.  He did not require any resuscitation other than standard warming, drying and stimulation.  APGARs 7 and 9. However, he was noted to have hoarse cry and inspiratory stridor and suprasternal retractions.  Delee suction performed x1 with 4 ml suctioned and no improvement in stridor.  Exam also notable for caput and molding.  Given continued inspiratory stridor and hoarse cry at 15 minutes of age, infant admitted to NICU as transition patient for closer monitoring.    _____________________ Electronically Signed By: Clinton Gallant, MD Neonatologist

## 2019-03-11 NOTE — H&P (Signed)
Selena Villanueva is a 38 y.o. G 2 P 1 at 55 w 2 days present with SROM and labor. OB History    Gravida  2   Para  1   Term  1   Preterm      AB      Living  1     SAB      TAB      Ectopic      Multiple  0   Live Births  1          Past Medical History:  Diagnosis Date  . Healthy adult   . Medical history non-contributory    Past Surgical History:  Procedure Laterality Date  . NO PAST SURGERIES    . No past surgery     Family History: family history includes Cancer in her mother; Healthy in her father. Social History:  reports that she has never smoked. She has never used smokeless tobacco. She reports current alcohol use. She reports that she does not use drugs.     Maternal Diabetes: No Genetic Screening: Normal Maternal Ultrasounds/Referrals: Normal Fetal Ultrasounds or other Referrals:  None Maternal Substance Abuse:  No Significant Maternal Medications:  None Significant Maternal Lab Results:  None Other Comments:  None  Review of Systems  All other systems reviewed and are negative.  Maternal Medical History:  Reason for admission: Rupture of membranes and contractions.     Dilation: 3 Effacement (%): 50 Station: -2 Exam by:: robinson rn Blood pressure 125/78, pulse 97, temperature 98.6 F (37 C), temperature source Oral, resp. rate 18, height 5\' 2"  (1.575 m), weight 77.1 kg, SpO2 99 %, unknown if currently breastfeeding. Maternal Exam:  Uterine Assessment: Contraction strength is moderate.  Contraction frequency is irregular.   Abdomen: Fetal presentation: vertex     Fetal Exam Fetal State Assessment: Category I - tracings are normal.     Physical Exam  Nursing note and vitals reviewed. Constitutional: She appears well-developed and well-nourished.  HENT:  Head: Normocephalic and atraumatic.  Eyes: Pupils are equal, round, and reactive to light.  Neck: Normal range of motion.    Prenatal labs: ABO, Rh: --/--/A POS, A  POS Performed at [facility], [employer] [address] [place],  [address]  [phone]/07 1157) Antibody: NEG (11/07 1157) Rubella: Immune (04/21 0000) RPR: Nonreactive (04/21 0000)  HBsAg: Negative (04/21 0000)  HIV: Non-reactive (04/21 0000)  GBS: Negative/-- (10/13 0000)   Assessment/Plan: IUP at term SROM Labor Anticipate NSVD  Cyril Mourning 03/11/2019, 2:06 PM

## 2019-03-12 LAB — CBC
HCT: 30.4 % — ABNORMAL LOW (ref 36.0–46.0)
Hemoglobin: 9.9 g/dL — ABNORMAL LOW (ref 12.0–15.0)
MCH: 28 pg (ref 26.0–34.0)
MCHC: 32.6 g/dL (ref 30.0–36.0)
MCV: 85.9 fL (ref 80.0–100.0)
Platelets: 215 10*3/uL (ref 150–400)
RBC: 3.54 MIL/uL — ABNORMAL LOW (ref 3.87–5.11)
RDW: 14.6 % (ref 11.5–15.5)
WBC: 13.8 10*3/uL — ABNORMAL HIGH (ref 4.0–10.5)
nRBC: 0 % (ref 0.0–0.2)

## 2019-03-12 LAB — RPR: RPR Ser Ql: NONREACTIVE

## 2019-03-12 NOTE — Lactation Note (Signed)
This note was copied from a baby's chart. Lactation Consultation Note  Patient Name: Selena Villanueva [id number] Date: 03/12/2019 Reason for consult: Initial assessment;Term;1st time breastfeeding  P2 mother whose infant is now 27 hours old and in the NICU.  This is a term baby.  Mother was not able to latch her first child but pumped and bottle fed for 8 months.  Per NP note, baby was admitted to the NICU for respiratory stridor and retractions.   Mother had the DEBP set up at bedside.  Encouraged to continue pumping for 15 minutes every 2-3 hours.  Encouraged hand expression before/after pumping to help with milk supply.  Colostrum container provided and milk storage times discussed.  Mother will ask NICU for labels and will take all EBM to the NICU within 4 hours.  Mom made aware of O/P services, breastfeeding support groups, community resources, and our phone # for post-discharge questions.  "Providing Breast Milk For Your Baby in the NICU" given.  Mother has a DEBP for home use.  Informed her that she is welcome to use lactation services after discharge when she is able to visit baby.  She will call for assistance as needed.   Maternal Data Formula Feeding for Exclusion: Yes Reason for exclusion: Mother's choice to formula and breast feed on admission Has patient been taught Hand Expression?: Yes Does the patient have breastfeeding experience prior to this delivery?: No  Feeding Feeding Type: Formula  LATCH Score                   Interventions    Lactation Tools Discussed/Used Pump Review: (Mother had no questions related to pumping)   Consult Status Consult Status: Follow-up Date: 03/13/19 Follow-up type: In-patient    Selena Villanueva 03/12/2019, 2:52 PM

## 2019-03-12 NOTE — Anesthesia Postprocedure Evaluation (Signed)
Anesthesia Post Note  Patient: Chief Operating Officer  Procedure(s) Performed: AN AD HOC LABOR EPIDURAL     Patient location during evaluation: Mother Baby Anesthesia Type: Epidural Level of consciousness: awake and alert and oriented Pain management: satisfactory to patient Vital Signs Assessment: post-procedure vital signs reviewed and stable Respiratory status: spontaneous breathing and nonlabored ventilation Cardiovascular status: stable Postop Assessment: no headache, no backache, no signs of nausea or vomiting, adequate PO intake, patient able to bend at knees and able to ambulate (patient up walking) Anesthetic complications: no    Last Vitals:  Vitals:   03/12/19 0100 03/12/19 0500  BP: 100/63 106/63  Pulse: 79 81  Resp: 18 18  Temp: 37.2 C 36.7 C  SpO2:  99%    Last Pain:  Vitals:   03/12/19 0500  TempSrc: Oral  PainSc:    Pain Goal:                   [employer]

## 2019-03-12 NOTE — Lactation Note (Signed)
This note was copied from a baby's chart. Lactation Consultation Note  Patient Name: Selena Villanueva [id number] Date: 03/12/2019 Reason for consult: Initial assessment  LC Initial Visit:  Attempted to visit with mother, however, she is not in her room currently.  Will return later today.  Brochure left at bedside.   Maternal Data    Feeding Feeding Type: Formula  LATCH Score                   Interventions    Lactation Tools Discussed/Used     Consult Status Consult Status: Follow-up Date: 03/12/19 Follow-up type: In-patient    Selena Villanueva Selena Villanueva 03/12/2019, 10:38 AM

## 2019-03-12 NOTE — Progress Notes (Signed)
PPD # 1  Patient doing well. Some Cramping.  Baby in NICU due to respiratory issues.  BP 106/63   Pulse 81   Temp 98.1 F (36.7 C) (Oral)   Resp 18   Ht 5\' 2"  (1.575 m)   Wt 77.1 kg   SpO2 99%   Breastfeeding Unknown   BMI 31.09 kg/m  Results for orders placed or performed during the hospital encounter of 03/11/19 (from the past 24 hour(s))  POCT fern test     Status: None   Collection Time: 03/11/19 11:40 AM  Result Value Ref Range   POCT Fern Test Positive = ruptured amniotic membanes   SARS CORONAVIRUS 2 (TAT 6-24 HRS) Nasopharyngeal Nasopharyngeal Swab     Status: None   Collection Time: 03/11/19 11:57 AM   Specimen: Nasopharyngeal Swab  Result Value Ref Range   SARS Coronavirus 2 NEGATIVE NEGATIVE  CBC     Status: Abnormal   Collection Time: 03/11/19 11:57 AM  Result Value Ref Range   WBC 10.5 4.0 - 10.5 K/uL   RBC 4.02 3.87 - 5.11 MIL/uL   Hemoglobin 11.3 (L) 12.0 - 15.0 g/dL   HCT 34.3 (L) 36.0 - 46.0 %   MCV 85.3 80.0 - 100.0 fL   MCH 28.1 26.0 - 34.0 pg   MCHC 32.9 30.0 - 36.0 g/dL   RDW 14.5 11.5 - 15.5 %   Platelets 244 150 - 400 K/uL   nRBC 0.0 0.0 - 0.2 %  Type and screen [place]     Status: None   Collection Time: 03/11/19 11:57 AM  Result Value Ref Range   ABO/RH(D) A POS    Antibody Screen NEG    Sample Expiration      03/14/2019,2359 Performed at [facility], [place] [address], [place], [place] [address]   ABO/Rh     Status: None   Collection Time: 03/11/19 11:57 AM  Result Value Ref Range   ABO/RH(D)      A POS Performed at [facility] [address] [place], [place] [address]   CBC     Status: Abnormal   Collection Time: 03/12/19  5:05 AM  Result Value Ref Range   WBC 13.8 (H) 4.0 - 10.5 K/uL   RBC 3.54 (L) 3.87 - 5.11 MIL/uL   Hemoglobin 9.9 (L) 12.0 - 15.0 g/dL   HCT 30.4 (L) 36.0 - 46.0 %   MCV 85.9 80.0 - 100.0 fL   MCH 28.0 26.0 - 34.0 pg   MCHC 32.6 30.0 - 36.0 g/dL   RDW 14.6 11.5 - 15.5 %   Platelets 215 150 - 400 K/uL   nRBC 0.0 0.0 - 0.2 %   Abdomen soft and non tender Lochia WNL  IMPRESSION: PPD # 1  Doing well Routine care

## 2019-03-13 ENCOUNTER — Encounter (HOSPITAL_COMMUNITY): Payer: Self-pay | Admitting: *Deleted

## 2019-03-13 NOTE — Lactation Note (Signed)
This note was copied from a baby's chart. Lactation Consultation Note  Patient Name: Selena Villanueva [id number] Date: 03/13/2019  Mom will be discharged today.  She is pumping every 3 hours and obtaining small amounts of colostrum.  Discussed milk coming to volume.  Mom has a DEBP at home.  Baby is not going to breast yet.  He remains in NICU.  Encouraged to call prn.   Maternal Data    Feeding Feeding Type: Formula  LATCH Score                   Interventions    Lactation Tools Discussed/Used     Consult Status      Ave Filter 03/13/2019, 1:11 PM

## 2019-03-13 NOTE — Progress Notes (Signed)
Pt wants to sleep and states she will use call light for assistance if needed; does not want nurse to round.

## 2019-03-13 NOTE — Discharge Summary (Signed)
Obstetric Discharge Summary Reason for Admission: rupture of membranes Prenatal Procedures: none Intrapartum Procedures: spontaneous vaginal delivery Postpartum Procedures: none Complications-Operative and Postpartum: 2nd degree perineal laceration Hemoglobin  Date Value Ref Range Status  03/12/2019 9.9 (L) 12.0 - 15.0 g/dL Final   HCT  Date Value Ref Range Status  03/12/2019 30.4 (L) 36.0 - 46.0 % Final    Physical Exam:  General: alert, cooperative and appears stated age 38: appropriate Uterine Fundus: firm Incision: healing well, no significant drainage, no dehiscence, no significant erythema DVT Evaluation: No evidence of DVT seen on physical exam. Negative Homan's sign. No cords or calf tenderness. No significant calf/ankle edema.  Discharge Diagnoses: Term Pregnancy-delivered  Discharge Information: Date: 03/13/2019 Activity: pelvic rest Diet: routine Medications: PNV and Iron Condition: stable Instructions: refer to practice specific booklet Discharge to: home   Newborn Data: Live born female  Birth Weight: 8 lb 0.8 oz (3650 g) APGAR: 7, 9 Declined circ.  Newborn Delivery   Birth date/time: 03/11/2019 18:42:00 Delivery type: Vaginal, Vacuum (Extractor)      Home with mother.  Tyson Dense 03/13/2019, 9:51 AM

## 2019-03-16 ENCOUNTER — Inpatient Hospital Stay (HOSPITAL_COMMUNITY): Admission: AD | Admit: 2019-03-16 | Payer: BC Managed Care – PPO | Source: Home / Self Care

## 2020-06-13 ENCOUNTER — Other Ambulatory Visit: Payer: Self-pay | Admitting: Obstetrics and Gynecology

## 2020-06-13 DIAGNOSIS — R928 Other abnormal and inconclusive findings on diagnostic imaging of breast: Secondary | ICD-10-CM

## 2020-06-19 ENCOUNTER — Ambulatory Visit
Admission: RE | Admit: 2020-06-19 | Discharge: 2020-06-19 | Disposition: A | Discharge status: Home / Self Care | Payer: BC Managed Care – PPO | Source: Ambulatory Visit | Attending: Obstetrics and Gynecology | Admitting: Obstetrics and Gynecology

## 2020-06-19 ENCOUNTER — Other Ambulatory Visit: Payer: Self-pay

## 2020-06-19 DIAGNOSIS — R928 Other abnormal and inconclusive findings on diagnostic imaging of breast: Secondary | ICD-10-CM

## 2020-06-27 ENCOUNTER — Other Ambulatory Visit: Payer: Self-pay

## 2021-09-24 IMAGING — MG MM DIGITAL DIAGNOSTIC UNILAT*R* W/ TOMO W/ CAD
4 series · 4 of 12 positions shown · non-contrast
Comparison: Screening exam, which was the baseline study,
06/10/2020.

CLINICAL DATA: Screening recall for a possible right breast mass.

EXAM:
DIGITAL DIAGNOSTIC UNILATERAL RIGHT MAMMOGRAM WITH TOMOSYNTHESIS AND
CAD; ULTRASOUND RIGHT BREAST LIMITED
TECHNIQUE: Right digital diagnostic mammography and breast tomosynthesis was
performed. The images were evaluated with computer-aided detection.;
Targeted ultrasound examination of the right breast was performed

[R MLO synth-2D]
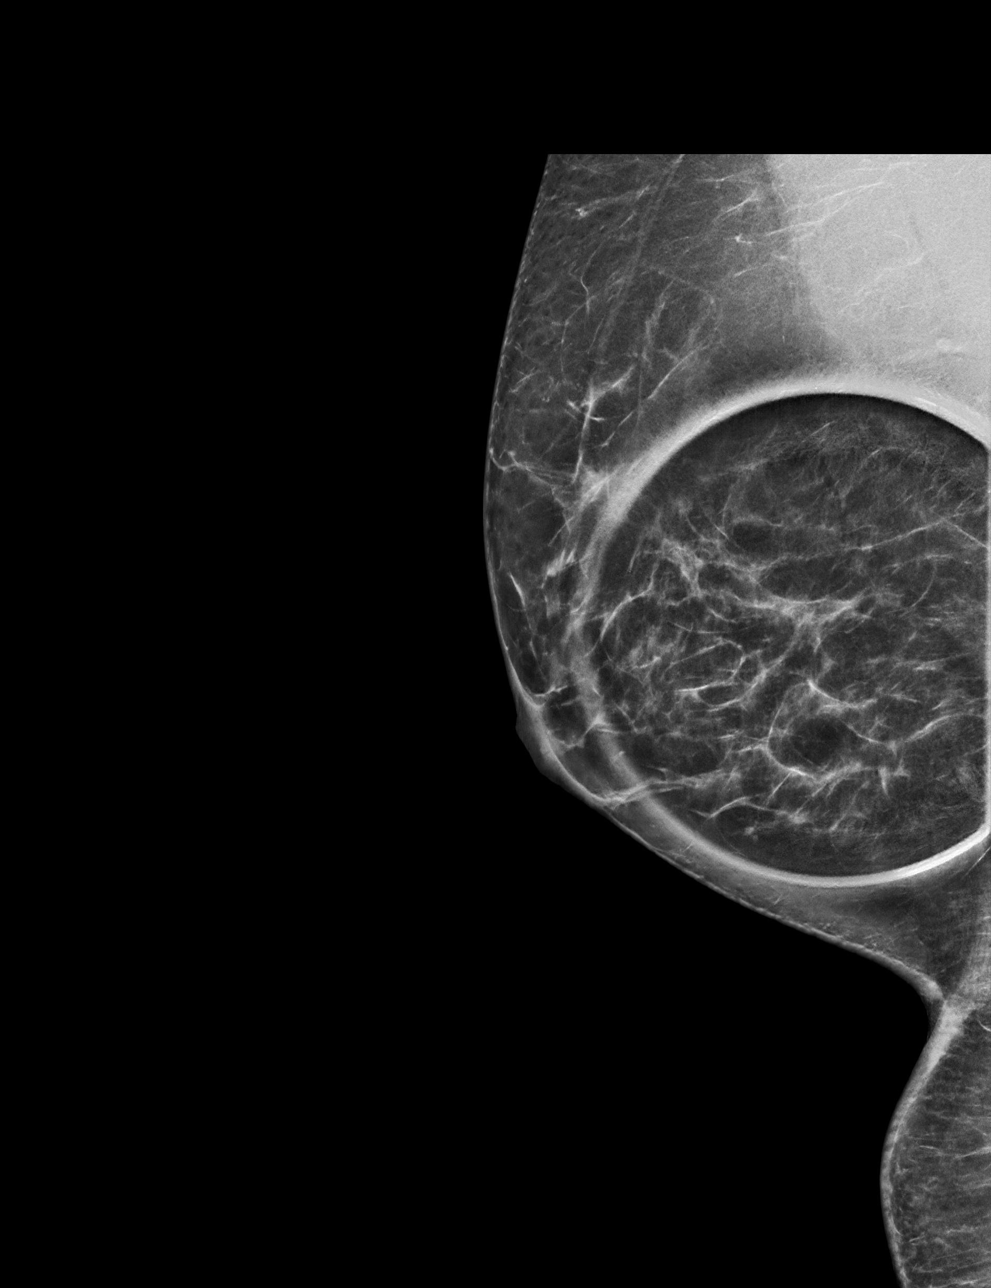

[R CC synth-2D]
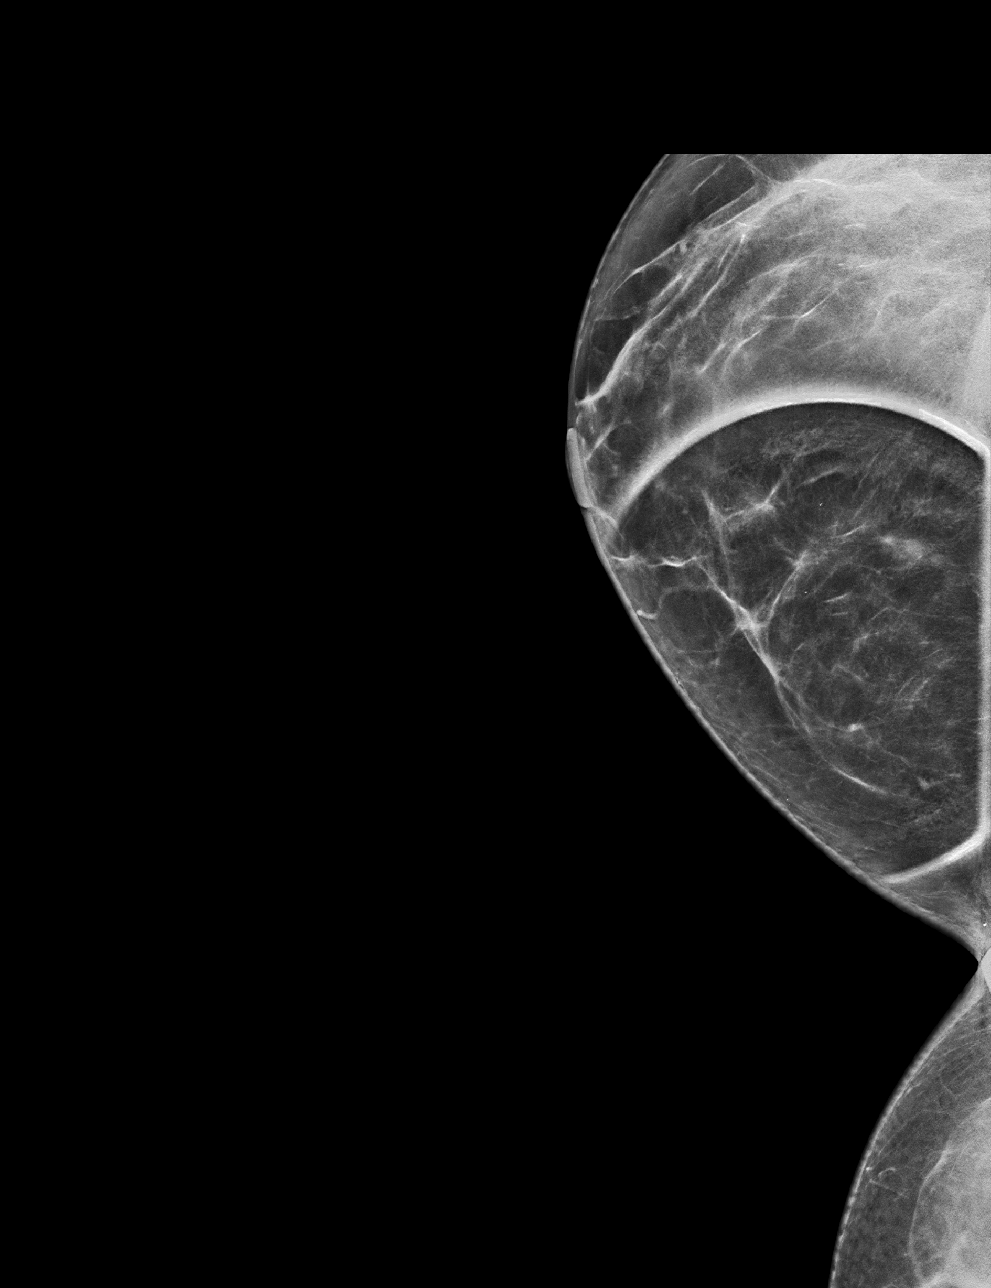

[R CC tomo · tomo slice 34/67.0]
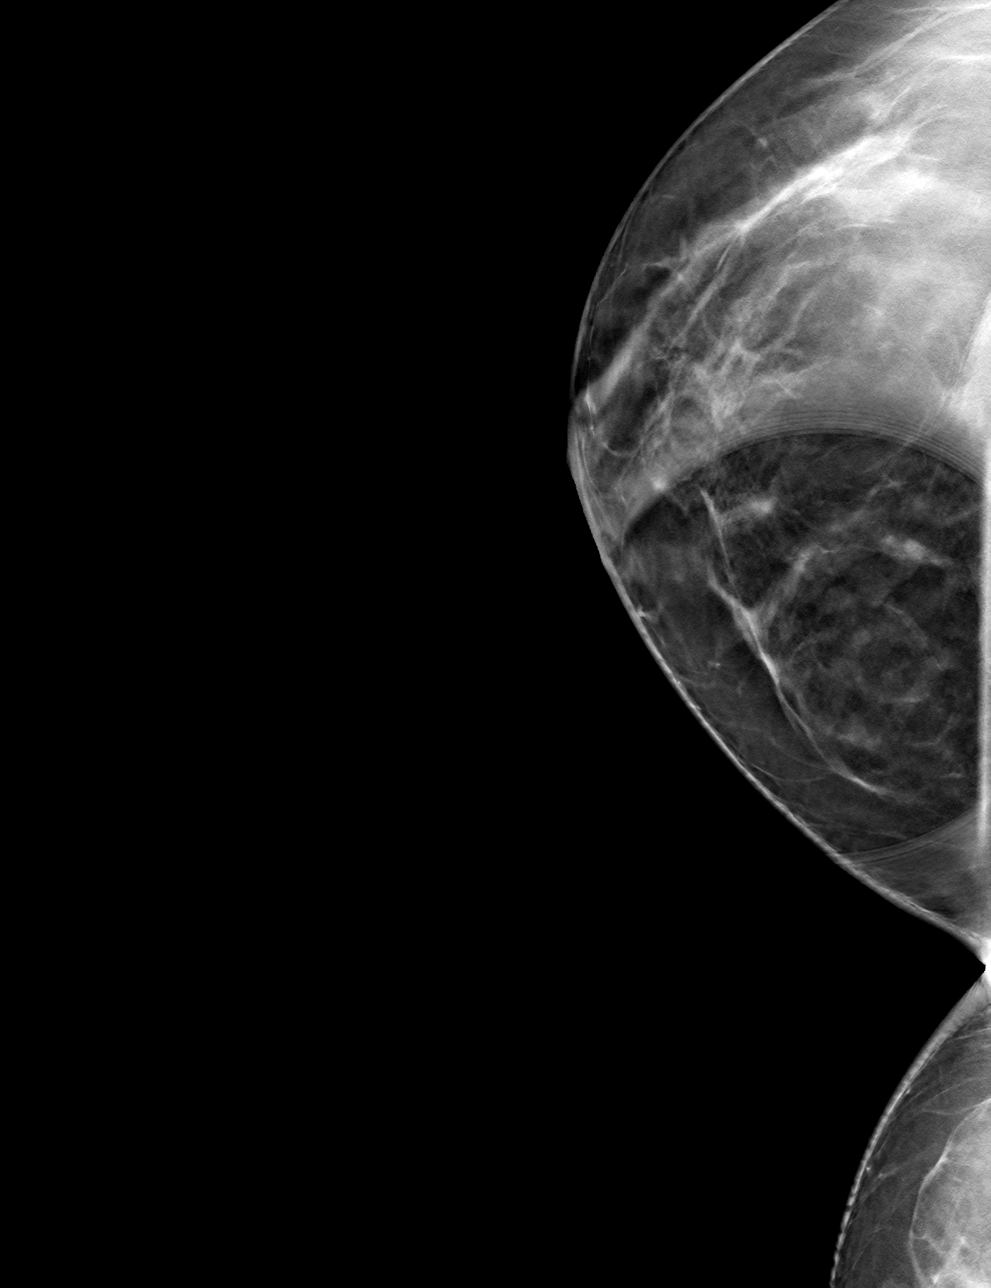

[R MLO tomo · tomo slice 35/70.0]
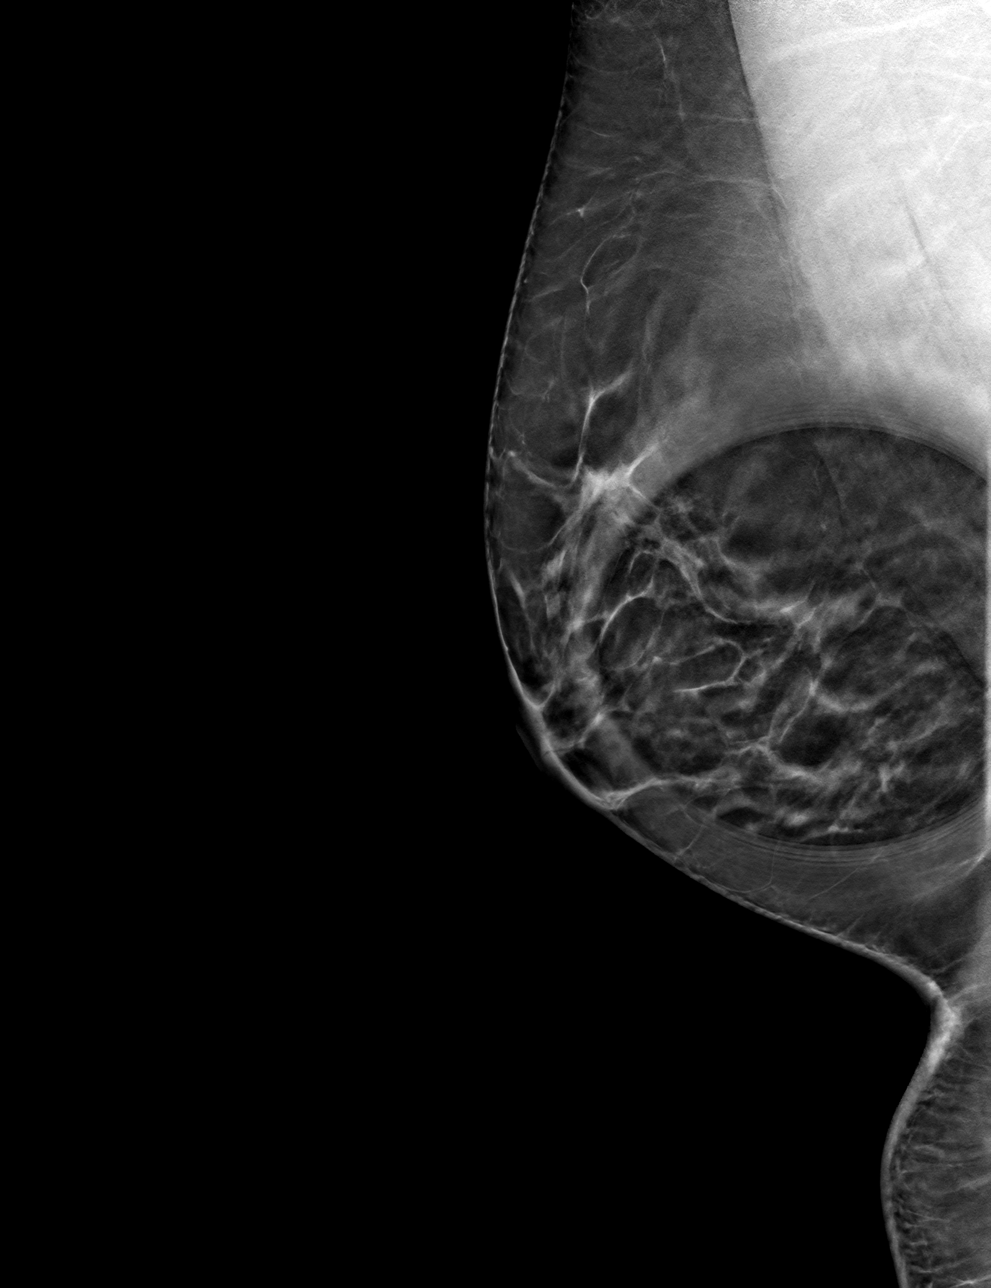

[4 of 12 positions shown; findings below may reference images not displayed]

ACR Breast Density Category b: There are scattered areas of
fibroglandular density.
FINDINGS: The possible mass, noted in the posteromedial aspect the right
breast on the current screening study, persists as a small, oval,
partly circumscribed mass on the spot-compression cc view, but is
not visualized on the MLO view.

There are no other masses, no areas of architectural distortion and
no suspicious calcifications.

Targeted ultrasound is performed, showing a small oval cyst in the
right breast at 2 o'clock, 3 cm the nipple, measuring 4 x 1 x 3 mm,
consistent in size, shape and location to the mammographic finding.
There are no solid masses or suspicious lesions.
IMPRESSION: 1. No evidence of breast malignancy.
2. Small benign right breast cyst.

RECOMMENDATION:
Screening mammogram in one year.(Code:79-O-GIO)

I have discussed the findings and recommendations with the patient.
If applicable, a reminder letter will be sent to the patient
regarding the next appointment.

BI-RADS CATEGORY  2: Benign.

## 2021-09-24 IMAGING — US US BREAST*R* LIMITED INC AXILLA
1 series · 7 of 7 positions shown · non-contrast
Comparison: Screening exam, which was the baseline study,
06/10/2020.

CLINICAL DATA: Screening recall for a possible right breast mass.

EXAM:
DIGITAL DIAGNOSTIC UNILATERAL RIGHT MAMMOGRAM WITH TOMOSYNTHESIS AND
CAD; ULTRASOUND RIGHT BREAST LIMITED
TECHNIQUE: Right digital diagnostic mammography and breast tomosynthesis was
performed. The images were evaluated with computer-aided detection.;
Targeted ultrasound examination of the right breast was performed

[Series 1: us breast*right* limited inc axilla · 0.06mm/px · 7 of 7 slices shown]
[im 1/7]
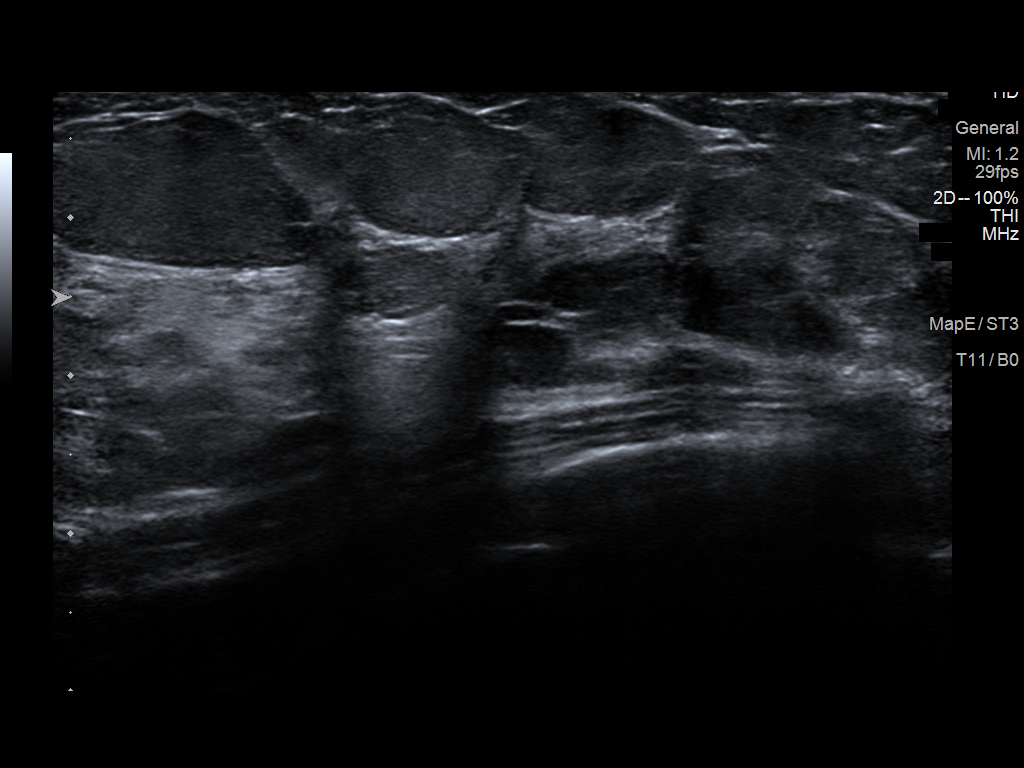
[im 2/7]
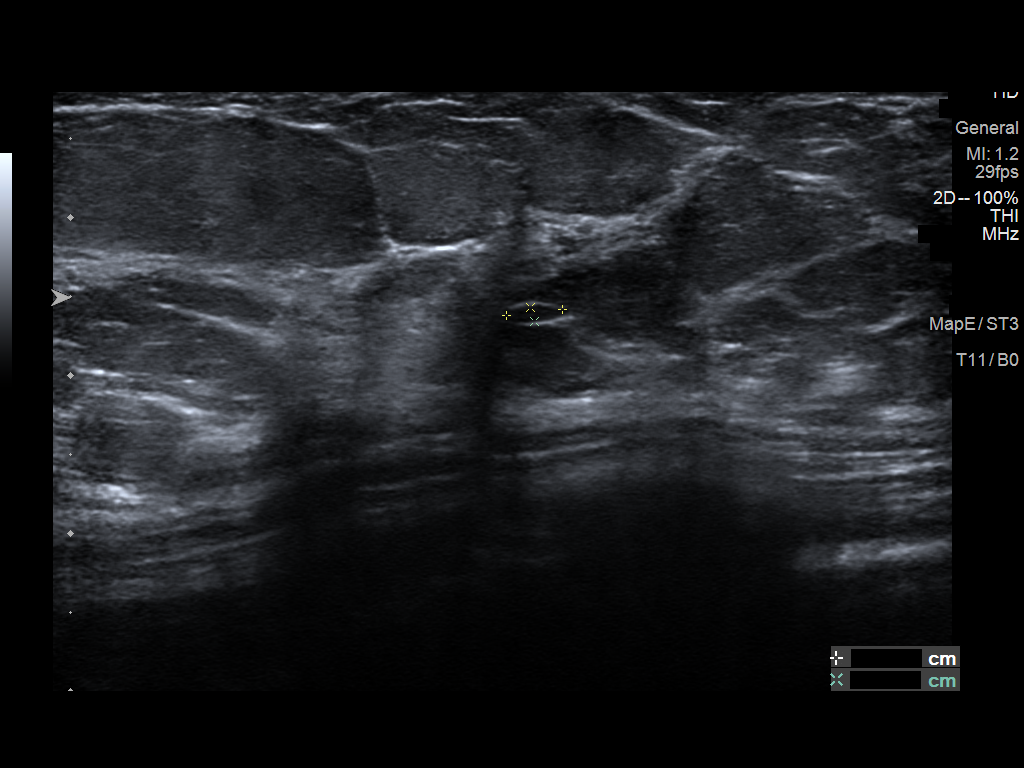
[im 3/7]
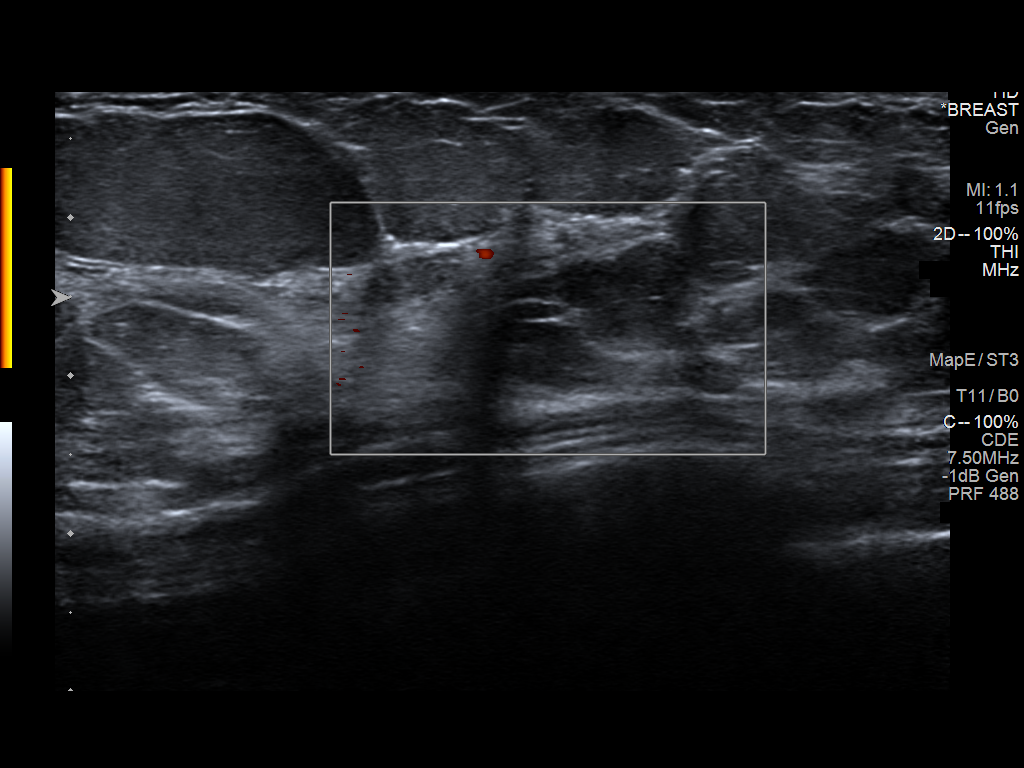
[im 4/7]
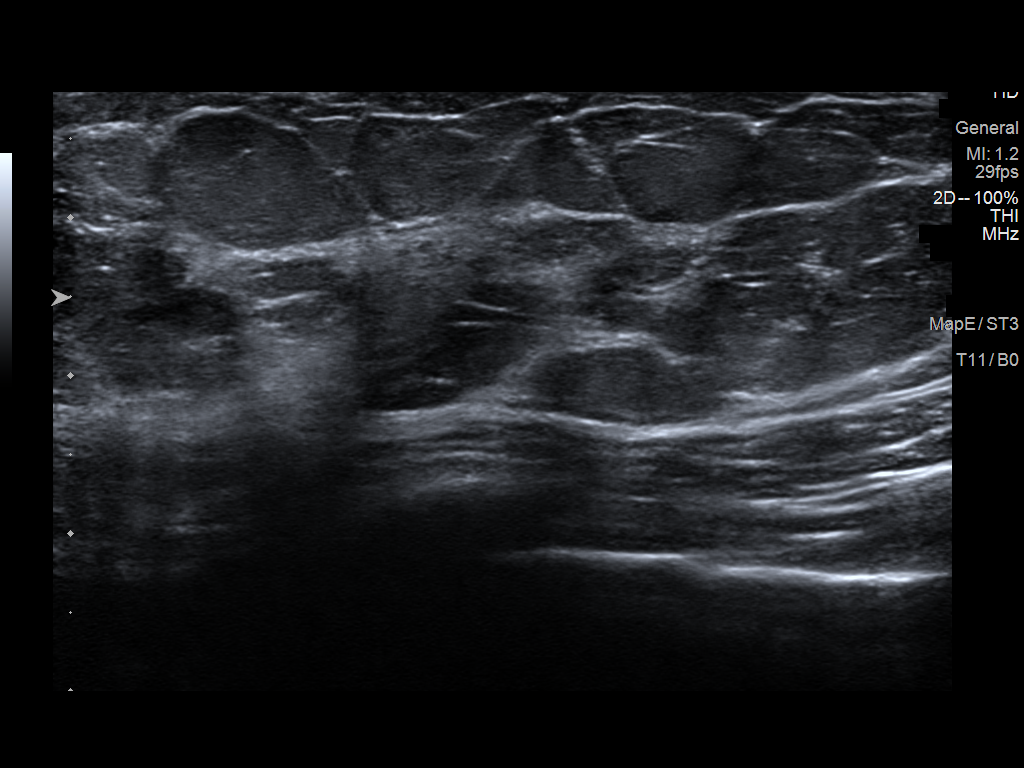
[im 5/7]
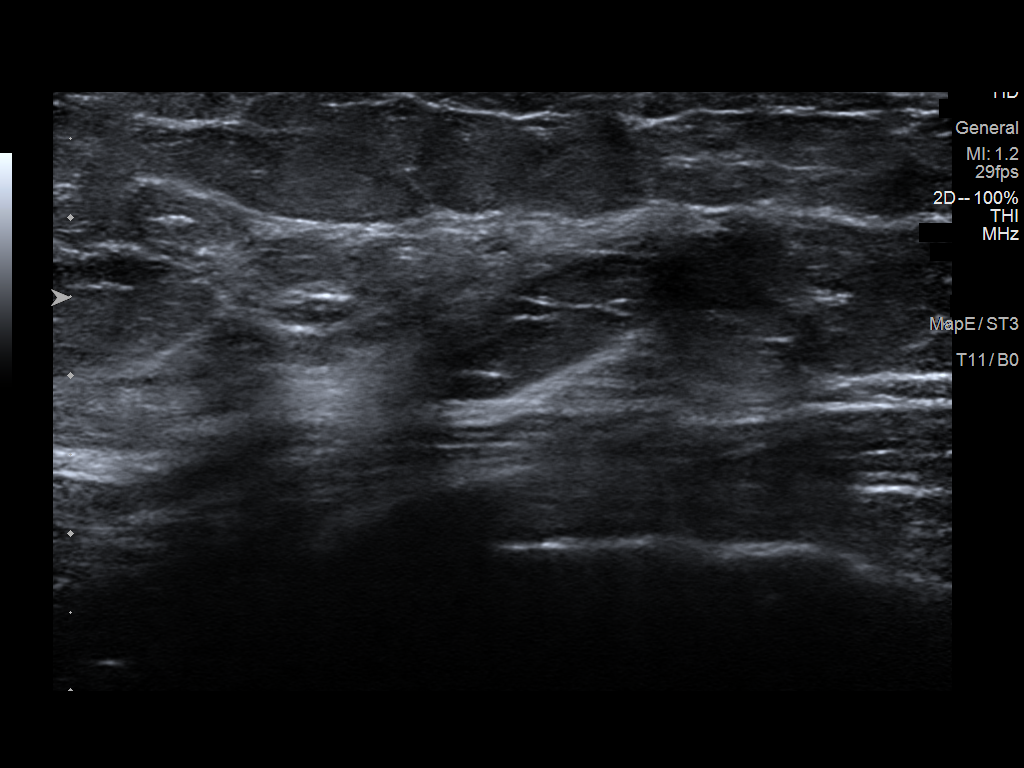
[im 6/7]
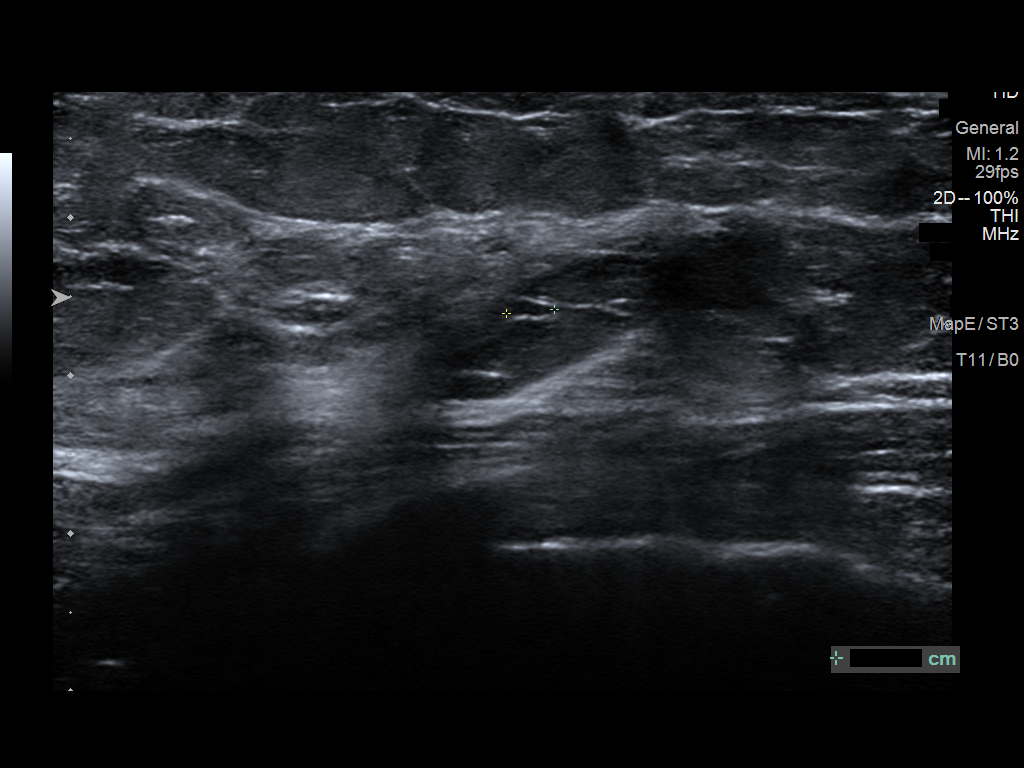
[im 7/7]
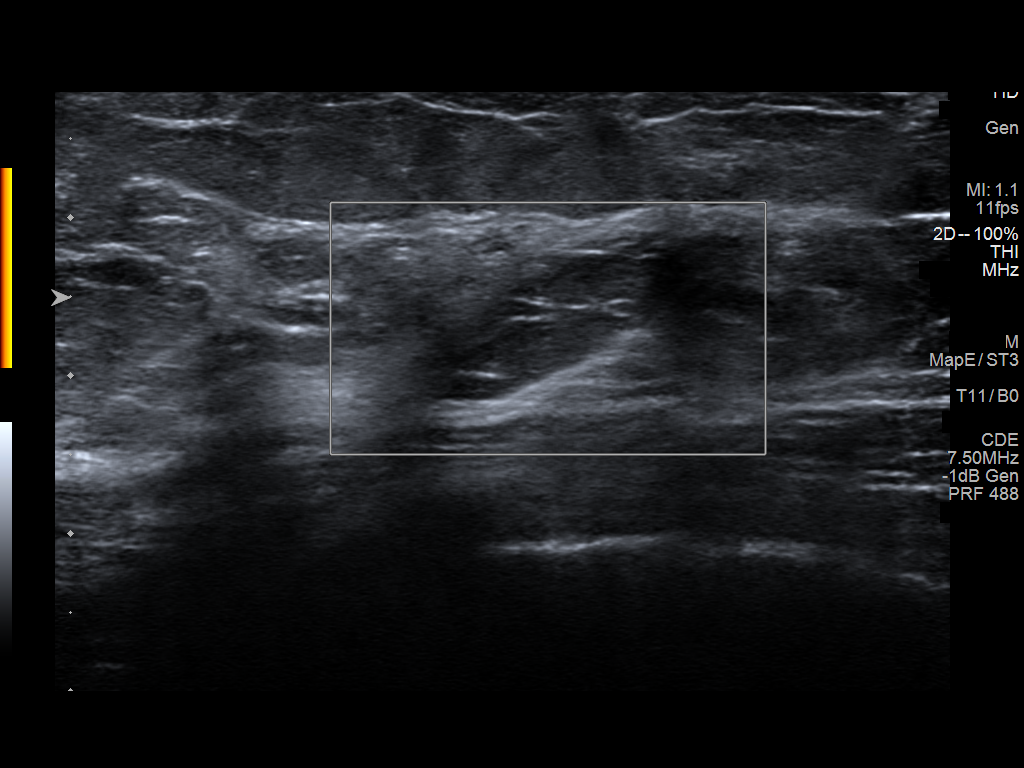

[7 of 7 positions shown; findings below may reference images not displayed]

ACR Breast Density Category b: There are scattered areas of
fibroglandular density.
FINDINGS: The possible mass, noted in the posteromedial aspect the right
breast on the current screening study, persists as a small, oval,
partly circumscribed mass on the spot-compression cc view, but is
not visualized on the MLO view.

There are no other masses, no areas of architectural distortion and
no suspicious calcifications.

Targeted ultrasound is performed, showing a small oval cyst in the
right breast at 2 o'clock, 3 cm the nipple, measuring 4 x 1 x 3 mm,
consistent in size, shape and location to the mammographic finding.
There are no solid masses or suspicious lesions.
IMPRESSION: 1. No evidence of breast malignancy.
2. Small benign right breast cyst.

RECOMMENDATION:
Screening mammogram in one year.(Code:79-O-GIO)

I have discussed the findings and recommendations with the patient.
If applicable, a reminder letter will be sent to the patient
regarding the next appointment.

BI-RADS CATEGORY  2: Benign.
